# Patient Record
Sex: Male | Born: 1940 | Race: White | Hispanic: No | Marital: Married | Smoking: Never smoker
Health system: Southern US, Community
[De-identification: ages and names within clinical notes are randomized; demographics above are authoritative.]

## PROBLEM LIST (undated history)

## (undated) DIAGNOSIS — I10 Essential (primary) hypertension: Secondary | ICD-10-CM

## (undated) DIAGNOSIS — K5792 Diverticulitis of intestine, part unspecified, without perforation or abscess without bleeding: Secondary | ICD-10-CM

## (undated) DIAGNOSIS — K219 Gastro-esophageal reflux disease without esophagitis: Secondary | ICD-10-CM

## (undated) DIAGNOSIS — I452 Bifascicular block: Secondary | ICD-10-CM

## (undated) DIAGNOSIS — R42 Dizziness and giddiness: Secondary | ICD-10-CM

## (undated) HISTORY — PX: COLON RESECTION: SHX5231

---

## 2001-08-17 ENCOUNTER — Encounter: Admission: RE | Admit: 2001-08-17 | Discharge: 2001-08-17 | Payer: Self-pay | Admitting: Family Medicine

## 2001-08-17 ENCOUNTER — Encounter: Payer: Self-pay | Admitting: Family Medicine

## 2002-01-06 ENCOUNTER — Encounter: Payer: Self-pay | Admitting: Family Medicine

## 2002-01-06 ENCOUNTER — Encounter: Admission: RE | Admit: 2002-01-06 | Discharge: 2002-01-06 | Payer: Self-pay | Admitting: Family Medicine

## 2002-02-09 ENCOUNTER — Ambulatory Visit (HOSPITAL_COMMUNITY): Admission: RE | Admit: 2002-02-09 | Discharge: 2002-02-09 | Payer: Self-pay | Admitting: Gastroenterology

## 2002-03-21 ENCOUNTER — Inpatient Hospital Stay (HOSPITAL_COMMUNITY): Admission: EM | Admit: 2002-03-21 | Discharge: 2002-03-24 | Payer: Self-pay | Admitting: Emergency Medicine

## 2002-03-21 ENCOUNTER — Encounter: Payer: Self-pay | Admitting: Family Medicine

## 2002-03-21 ENCOUNTER — Encounter (INDEPENDENT_AMBULATORY_CARE_PROVIDER_SITE_OTHER): Payer: Self-pay | Admitting: Specialist

## 2002-03-21 ENCOUNTER — Encounter: Admission: RE | Admit: 2002-03-21 | Discharge: 2002-03-21 | Payer: Self-pay | Admitting: Family Medicine

## 2005-01-08 ENCOUNTER — Encounter: Admission: RE | Admit: 2005-01-08 | Discharge: 2005-01-08 | Payer: Self-pay | Admitting: Family Medicine

## 2008-03-09 ENCOUNTER — Encounter: Admission: RE | Admit: 2008-03-09 | Discharge: 2008-03-09 | Payer: Self-pay | Admitting: Family Medicine

## 2011-03-13 NOTE — H&P (Signed)
[facility]  Patient:    AIRON, [name] Visit Number: [id number] MRN: [id number]          Service Type: MED Location: [phone] Attending Physician:  [name] Dictated by:   Zigmund Daniel, M.D. Admit Date:  03/21/2002                           History and Physical  CHIEF COMPLAINT:  Abdominal pain.  HISTORY OF PRESENT ILLNESS:  The patient is a generally healthy 70 year old man who felt well until last night about 9 oclock when he insidiously developed mid abdominal pain.  It became quite severe, and he vomited a number of times.  It persisted today, and he saw Dr. Arvilla Market at the office, and he found that he had abdominal tenderness and an elevation of his white blood cell count, and he sent him for a CT scan of the abdomen which showed a thickened loop of small bowel consistent either with Meckels diverticulitis or an intussusceptum.  The patient is feeling a little better but still has pain and tenderness.  He was advised to come into the hospital for exploratory laparotomy and accepted my recommendation.  PAST MEDICAL HISTORY:  Hypertension, on Monopril.  He takes Pravachol for high cholesterol.  He has no history of heart or lung disease.  No chronic GI diseases.  No previous surgery.  No serious chronic ailments of any type.  ALLERGIES:  He denied medication allergies.  MEDICATIONS:  He is on no other medications.  FAMILY HISTORY, REVIEW OF SYSTEMS, CHILDHOOD ILLNESSES:  Unremarkable.  SOCIAL HISTORY:  He does not smoke or drink.  PHYSICAL EXAMINATION:  GENERAL:  Healthy-appearing man in slight pain.  Mental status normal.  VITAL SIGNS:  Normal, as recorded by nursing staff.  HEENT:  Head, neck, eyes, ears, nose, mouth, and throat are unremarkable.  CHEST:  Clear to auscultation.  HEART:  Rate and rhythm normal.  No murmur or gallop.  ABDOMEN:  No mass.  There is moderate mid abdominal tenderness with  slight rebound tenderness.  No organomegaly detectable.  No hernia.  GENITOURINARY:  Normal.  RECTAL:  Normal.  EXTREMITIES:  Normal.  NEUROLOGIC:  No gross abnormalities.  SKIN:  No lesions noted.  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  PLAN:  Immediate exploratory laparotomy. Dictated by:   Zigmund Daniel, M.D. Attending Physician:  [name] DD:  03/21/02 TD:  03/23/02 Job: [id number]

## 2011-03-13 NOTE — Op Note (Signed)
[facility]  Patient:    SHANON, BECVAR Visit Number: [id number] MRN: [id number]          Service Type: MED Location: [phone] Attending Physician:  Meredith Leeds Dictated by:   Zigmund Daniel, M.D. Proc. Date: 03/21/02 Admit Date:  03/21/2002   CC:         Desma Maxim, M.D.   Operative Report  PREOPERATIVE DIAGNOSES:       Acute abdomen, probable intussusception.  POSTOPERATIVE DIAGNOSIS:      Meckels diverticulitis.  OPERATION:                    Meckels diverticulectomy.  SURGEON:                      Zigmund Daniel, M.D.  ANESTHESIA:                   General.  DESCRIPTION OF PROCEDURE:     After the patient was monitored and anesthetized, and had routine preparation and draping of the abdomen, I made an incision between the umbilicus and pubic symphysis and entered the peritoneal cavity.  A small amount of serous-type noncloudy fluid was present.  I reached down and palpated a thickened piece of intestine, and easily pulled it up into the wound; it was quite obviously an inflamed Meckels diverticulum.  It was very broad based and the bowel at that point was quite broad as well; it looked like it could easily be resected at its base.  The thickness of the diverticulum was normal at the base.  I segmentally divided its mesentery between Kelly clamps, and ligated the vessels with 2-0 silk and then stapled and removed it.  The staple line appeared secure and the lumen of the small intestine appeared adequate.  Hemostasis was good.  I then irrigated the abdominal cavity briefly, and removed irrigant.  Sponge, needle and instrument counts were correct.  I inspected the distal small bowel, and it appeared to be normal.  I inspected the proximal bowel and it also was normal. The liver felt normal.  I closed the fascia with running #1 PDS and closed the skin with staples.  The patient was stable through the procedure and  tolerated it well. Dictated by:   Zigmund Daniel, M.D. Attending Physician:  Meredith Leeds DD:  03/21/02 TD:  03/23/02 Job: [id number]

## 2011-03-13 NOTE — Discharge Summary (Signed)
[facility]  Patient:    [name], [name] Visit Number: [id number] MRN: [id number]          Service Type: MED Location: [phone] Attending Physician:  Meredith Leeds Dictated by:   Zigmund Daniel, M.D. Admit Date:  03/21/2002 Discharge Date: 03/24/2002   CC:         Desma Maxim, M.D.   Discharge Summary  HISTORY OF PRESENT ILLNESS:  The patient is a 70 year old white male who had a 1-day history of insidiously developing right-sided abdominal pain.  It was then central at first.  Dr. Arvilla Market saw him and found abdominal tenderness and elevation of the white blood cell count.  A CT scan demonstrated a thickened loop of small bowel consistent with Meckels diverticulitis or an intussusception.  The patient was admitted to the hospital for surgery after I saw him in the emergency department.  He has high cholesterol and hypertension but general health is good beyond that.  He is a nonsmoker and does not drink alcoholic beverages.  HOSPITAL COURSE:  As soon as possible after fluid resuscitation and administration of antibiotics, I took the patient to the operating room.  He had Meckels diverticulitis and underwent a Meckels diverticulectomy by stapling the diverticulum at the base.  He made a steady recovery after that and had no complications of infection nor otherwise.  He was discharged on the third postoperative day with his staples in place.  Pathology was benign and was consistent with diverticulitis.  I gave him a prescription for Vicodin and asked him to see me in several days for removal of his staples.  DIAGNOSIS:  Meckels diverticulitis.  OPERATION:  Meckels diverticulectomy.  DISCHARGE CONDITION:  Improved. Dictated by:   Zigmund Daniel, M.D. Attending Physician:  Meredith Leeds DD:  04/07/02 TD:  04/10/02 Job: [id number]

## 2011-03-13 NOTE — Procedures (Signed)
[facility]  Patient:    [name], [name] Visit Number: [id number] MRN: [id number]          Service Type: END Location: ENDO Attending Physician:  Dennison Bulla Ii Dictated by:   Verlin Grills, M.D. Proc. Date: 02/09/02 Admit Date:  02/09/2002 Discharge Date: 02/09/2002   CC:         Desma Maxim, M.D.   Procedure Report  DATE OF BIRTH:  [birth year].  REFERRING PHYSICIAN:  Desma Maxim, M.D.  PROCEDURE PERFORMED:  Screening colonoscopy.  ENDOSCOPIST:  Verlin Grills, M.D.  INDICATIONS FOR PROCEDURE:  The patient is a 70 year old male.  He is scheduled to undergo his first screening colonoscopy with polypectomy to prevent colon cancer.  His flexible proctosigmoidoscopy performed January 1998 was normal.  I discussed with the patient the complications associated with colonoscopy and polypectomy including a 15 per 1000 risk of bleeding and 4 per 1000 risk of colon perforation requiring surgical repair.  The patient has signed the operative permit.  PREMEDICATION:  Versed 10 mg, fentanyl 50 mcg.  ENDOSCOPE:  Olympus pediatric video colonoscope.  DESCRIPTION OF PROCEDURE:  After obtaining informed consent, the patient was placed in the left lateral decubitus position.  I administered intravenous fentanyl and intravenous Versed to achieve conscious sedation for the procedure.  The patients blood pressure, oxygen saturation and cardiac rhythm were monitored throughout the procedure and documented in the medical record.  Anal inspection was normal.  Digital rectal exam revealed a nonnodular prostate. The Olympus pediatric video colonoscope was then introduced into the rectum and easily advanced to the cecum.  Colonic preparation for the exam today was excellent.  Rectum:  Normal.  Sigmoid colon and descending colon:  Normal.  Splenic flexure:  Normal.  Transverse colon:  Normal.  Hepatic  flexure:  Normal.  Ascending colon:  Normal.  Cecum and ileocecal valve:  Normal.  ASSESSMENT:  Normal screening proctocolonoscopy to the cecum.  No endoscopic evidence for the presence of colorectal neoplasia. Dictated by:   Verlin Grills, M.D. Attending Physician:  Dennison Bulla Ii DD:  02/09/02 TD:  02/09/02 Job: [phone] [id number]

## 2012-02-22 ENCOUNTER — Ambulatory Visit
Admission: RE | Admit: 2012-02-22 | Discharge: 2012-02-22 | Disposition: A | Discharge status: Home / Self Care | Payer: Medicare Other | Source: Ambulatory Visit | Attending: Family Medicine | Admitting: Family Medicine

## 2012-02-22 ENCOUNTER — Other Ambulatory Visit: Payer: Self-pay | Admitting: Family Medicine

## 2012-02-22 DIAGNOSIS — R609 Edema, unspecified: Secondary | ICD-10-CM

## 2014-07-17 ENCOUNTER — Other Ambulatory Visit: Payer: Self-pay | Admitting: Gastroenterology

## 2014-08-01 ENCOUNTER — Other Ambulatory Visit: Payer: Self-pay | Admitting: Gastroenterology

## 2014-08-13 ENCOUNTER — Encounter (HOSPITAL_COMMUNITY): Payer: Self-pay | Admitting: *Deleted

## 2014-08-15 ENCOUNTER — Encounter (HOSPITAL_COMMUNITY): Payer: Self-pay | Admitting: Pharmacy Technician

## 2014-08-21 ENCOUNTER — Encounter (HOSPITAL_COMMUNITY): Payer: Medicare HMO | Admitting: Certified Registered Nurse Anesthetist

## 2014-08-21 ENCOUNTER — Ambulatory Visit (HOSPITAL_COMMUNITY)
Admission: RE | Admit: 2014-08-21 | Discharge: 2014-08-21 | Disposition: A | Discharge status: Home / Self Care | Payer: Medicare HMO | Source: Ambulatory Visit | Attending: Gastroenterology | Admitting: Gastroenterology

## 2014-08-21 ENCOUNTER — Ambulatory Visit (HOSPITAL_COMMUNITY): Payer: Medicare HMO | Admitting: Certified Registered Nurse Anesthetist

## 2014-08-21 ENCOUNTER — Encounter (HOSPITAL_COMMUNITY): Payer: Self-pay | Admitting: *Deleted

## 2014-08-21 ENCOUNTER — Encounter (HOSPITAL_COMMUNITY)
Admission: RE | Disposition: A | Discharge status: Home / Self Care | Payer: Medicare Other | Source: Ambulatory Visit | Attending: Gastroenterology

## 2014-08-21 DIAGNOSIS — E780 Pure hypercholesterolemia: Secondary | ICD-10-CM | POA: Diagnosis not present

## 2014-08-21 DIAGNOSIS — Z7982 Long term (current) use of aspirin: Secondary | ICD-10-CM | POA: Diagnosis not present

## 2014-08-21 DIAGNOSIS — R195 Other fecal abnormalities: Secondary | ICD-10-CM | POA: Insufficient documentation

## 2014-08-21 DIAGNOSIS — N400 Enlarged prostate without lower urinary tract symptoms: Secondary | ICD-10-CM | POA: Diagnosis not present

## 2014-08-21 DIAGNOSIS — I10 Essential (primary) hypertension: Secondary | ICD-10-CM | POA: Insufficient documentation

## 2014-08-21 HISTORY — DX: Essential (primary) hypertension: I10

## 2014-08-21 HISTORY — DX: Diverticulitis of intestine, part unspecified, without perforation or abscess without bleeding: K57.92

## 2014-08-21 HISTORY — PX: COLONOSCOPY WITH PROPOFOL: SHX5780

## 2014-08-21 HISTORY — DX: Gastro-esophageal reflux disease without esophagitis: K21.9

## 2014-08-21 SURGERY — COLONOSCOPY WITH PROPOFOL
Anesthesia: Monitor Anesthesia Care

## 2014-08-21 MED ORDER — PROPOFOL 10 MG/ML IV BOLUS
INTRAVENOUS | Status: DC | PRN
  Administered 2014-08-21: 30 mg via INTRAVENOUS
  Administered 2014-08-21 (×2): 20 mg via INTRAVENOUS

## 2014-08-21 MED ORDER — LIDOCAINE HCL (CARDIAC) 20 MG/ML IV SOLN
INTRAVENOUS | Status: AC
  Filled 2014-08-21: qty 5

## 2014-08-21 MED ORDER — SODIUM CHLORIDE 0.9 % IV SOLN: INTRAVENOUS | Status: DC

## 2014-08-21 MED ORDER — PROPOFOL 10 MG/ML IV BOLUS
INTRAVENOUS | Status: AC
Start: 2014-08-21 — End: 2014-08-21
  Filled 2014-08-21: qty 20

## 2014-08-21 MED ORDER — LIDOCAINE HCL (CARDIAC) 20 MG/ML IV SOLN
INTRAVENOUS | Status: DC | PRN
  Administered 2014-08-21: 80 mg via INTRAVENOUS

## 2014-08-21 MED ORDER — PROPOFOL INFUSION 10 MG/ML OPTIME
INTRAVENOUS | Status: DC | PRN
  Administered 2014-08-21: 200 ug/kg/min via INTRAVENOUS

## 2014-08-21 MED ORDER — ONDANSETRON HCL 4 MG/2ML IJ SOLN
INTRAMUSCULAR | Status: AC
  Filled 2014-08-21: qty 2

## 2014-08-21 MED ORDER — PROPOFOL 10 MG/ML IV BOLUS
INTRAVENOUS | Status: AC
  Filled 2014-08-21: qty 20

## 2014-08-21 MED ORDER — LACTATED RINGERS IV SOLN
INTRAVENOUS | Status: DC
  Administered 2014-08-21: 1000 mL via INTRAVENOUS

## 2014-08-21 MED ORDER — ONDANSETRON HCL 4 MG/2ML IJ SOLN
INTRAMUSCULAR | Status: DC | PRN
  Administered 2014-08-21: 4 mg via INTRAVENOUS

## 2014-08-21 SURGICAL SUPPLY — 22 items

## 2014-08-21 NOTE — Transfer of Care (Signed)
Immediate Anesthesia Transfer of Care Note  Patient: [name]  Procedure(s) Performed: Procedure(s) (LRB): COLONOSCOPY WITH PROPOFOL (N/A)  Patient Location: endoscopy  Anesthesia Type: MAC  Level of Consciousness: sedated, patient cooperative and responds to stimulation  Airway & Oxygen Therapy: Patient Spontanous Breathing and Patient connected to face mask oxgen  Post-op Assessment: Report given to endoscopy RN and Post -op Vital signs reviewed and stable  Post vital signs: Reviewed and stable  Complications: No apparent anesthesia complications

## 2014-08-21 NOTE — Discharge Instructions (Signed)

## 2014-08-21 NOTE — Anesthesia Preprocedure Evaluation (Signed)
Anesthesia Evaluation  Patient identified by MRN, date of birth, ID band Patient awake    Reviewed: Allergy & Precautions, H&P , NPO status , Patient's Chart, lab work & pertinent test results  Airway Mallampati: II  TM Distance: >3 FB Neck ROM: Full    Dental no notable dental hx.    Pulmonary neg pulmonary ROS,  breath sounds clear to auscultation  Pulmonary exam normal       Cardiovascular hypertension, Pt. on medications Rhythm:Regular Rate:Normal     Neuro/Psych negative neurological ROS  negative psych ROS   GI/Hepatic Neg liver ROS, GERD-  Medicated,  Endo/Other  negative endocrine ROS  Renal/GU negative Renal ROS     Musculoskeletal negative musculoskeletal ROS (+)   Abdominal   Peds  Hematology negative hematology ROS (+)   Anesthesia Other Findings   Reproductive/Obstetrics negative OB ROS                             Anesthesia Physical Anesthesia Plan  ASA: II  Anesthesia Plan: MAC   Post-op Pain Management:    Induction: Intravenous  Airway Management Planned:   Additional Equipment:   Intra-op Plan:   Post-operative Plan:   Informed Consent: I have reviewed the patients History and Physical, chart, labs and discussed the procedure including the risks, benefits and alternatives for the proposed anesthesia with the patient or authorized representative who has indicated his/her understanding and acceptance.   Dental advisory given  Plan Discussed with: CRNA  Anesthesia Plan Comments:         Anesthesia Quick Evaluation

## 2014-08-21 NOTE — H&P (Signed)
  Problem: Heme positive stool on aspirin therapy. Normal hemoglobin. Normal colonoscopy performed on 09/30/2006  History: The patient is a 73 year old male born [birth year]. He underwent a normal screening colonoscopy on 09/30/2006. He chronically takes aspirin 81 mg daily. Intermittently he experiences epigastric indigestion in the evening.  The patient feels well and reports no gastrointestinal bleeding. As part of his health maintenance physical exam, the patient submitted stool for Hemoccult testing and his stool was heme positive. His CBC was normal.  The patient is scheduled to undergo a diagnostic colonoscopy today.  Past medical history: Hypertension. Hypercholesterolemia. Benign prostatic hypertrophy. Meckel's diverticulum surgery.  Allergies: None  Exam: The patient is alert and lying comfortably on the endoscopy stretcher. Abdomen is soft and nontender to palpation. Lungs are clear to auscultation. Cardiac exam reveals a regular rhythm.  Plan: Proceed with diagnostic colonoscopy.

## 2014-08-21 NOTE — Anesthesia Postprocedure Evaluation (Signed)
Anesthesia Post Note  Patient: [name]  Procedure(s) Performed: Procedure(s) (LRB): COLONOSCOPY WITH PROPOFOL (N/A)  Anesthesia type: MAC  Patient location: PACU  Post pain: Pain level controlled  Post assessment: Post-op Vital signs reviewed  Last Vitals: BP 136/67  Pulse 52  Temp(Src) 36.5 C (Oral)  Resp 15  Ht 5\' 10"  (1.778 m)  Wt 190 lb (86.183 kg)  BMI 27.26 kg/m2  SpO2 97%  Post vital signs: Reviewed  Level of consciousness: awake  Complications: No apparent anesthesia complications

## 2014-08-21 NOTE — Op Note (Signed)
Problem: Heme positive stool on chronic aspirin therapy. Normal hemoglobin. Normal screening colonoscopy performed on 09/30/2006  Endoscopist: Earle Gell  Premedication: Propofol administered by anesthesia  Procedure: Diagnostic colonoscopy The patient was placed in the left lateral decubitus position. Anal inspection and digital rectal exam were normal. The Pentax pediatric colonoscope was introduced into the rectum and advanced to the cecum. A normal-appearing appendiceal orifice was identified. A normal-appearing ileocecal valve was identified. Colonic preparation for the exam today is good  Rectum. Normal. Retroflexed view of the distal rectum normal  Sigmoid colon and descending colon. Normal  Splenic flexure. Normal  Transverse colon. Normal  Hepatic flexure. Normal  Ascending colon. Normal  Cecum and ileocecal valve. Normal  Assessment: Normal diagnostic colonoscopy

## 2014-08-23 ENCOUNTER — Encounter (HOSPITAL_COMMUNITY): Payer: Self-pay | Admitting: Gastroenterology

## 2015-09-08 ENCOUNTER — Observation Stay (HOSPITAL_COMMUNITY)
Admission: EM | Admit: 2015-09-08 | Discharge: 2015-09-09 | Disposition: A | Discharge status: Home / Self Care | Payer: Commercial Managed Care - HMO | Attending: Internal Medicine | Admitting: Internal Medicine

## 2015-09-08 ENCOUNTER — Emergency Department (HOSPITAL_COMMUNITY): Payer: Commercial Managed Care - HMO

## 2015-09-08 ENCOUNTER — Encounter (HOSPITAL_COMMUNITY): Payer: Self-pay | Admitting: Family Medicine

## 2015-09-08 DIAGNOSIS — R42 Dizziness and giddiness: Secondary | ICD-10-CM | POA: Diagnosis not present

## 2015-09-08 DIAGNOSIS — I452 Bifascicular block: Secondary | ICD-10-CM | POA: Diagnosis not present

## 2015-09-08 DIAGNOSIS — E7800 Pure hypercholesterolemia, unspecified: Secondary | ICD-10-CM | POA: Insufficient documentation

## 2015-09-08 DIAGNOSIS — R079 Chest pain, unspecified: Principal | ICD-10-CM | POA: Insufficient documentation

## 2015-09-08 DIAGNOSIS — Z79899 Other long term (current) drug therapy: Secondary | ICD-10-CM | POA: Insufficient documentation

## 2015-09-08 DIAGNOSIS — I10 Essential (primary) hypertension: Secondary | ICD-10-CM | POA: Insufficient documentation

## 2015-09-08 DIAGNOSIS — R072 Precordial pain: Secondary | ICD-10-CM | POA: Diagnosis not present

## 2015-09-08 DIAGNOSIS — Z7982 Long term (current) use of aspirin: Secondary | ICD-10-CM | POA: Insufficient documentation

## 2015-09-08 HISTORY — DX: Essential (primary) hypertension: I10

## 2015-09-08 HISTORY — DX: Bifascicular block: I45.2

## 2015-09-08 HISTORY — DX: Dizziness and giddiness: R42

## 2015-09-08 LAB — BASIC METABOLIC PANEL
ANION GAP: 7 (ref 5–15)
BUN: 13 mg/dL (ref 6–20)
CHLORIDE: 109 mmol/L (ref 101–111)
CO2: 25 mmol/L (ref 22–32)
CREATININE: 0.95 mg/dL (ref 0.61–1.24)
Calcium: 8.7 mg/dL — ABNORMAL LOW (ref 8.9–10.3)
GFR calc non Af Amer: >60 mL/min (ref >60)
GLUCOSE: 111 mg/dL — AB (ref 65–99)
Potassium: 4 mmol/L (ref 3.5–5.1)
Sodium: 141 mmol/L (ref 135–145)

## 2015-09-08 LAB — I-STAT TROPONIN, ED
Troponin i, poc: 0 ng/mL (ref 0.00–0.08)
Troponin i, poc: 0.01 ng/mL (ref 0.00–0.08)

## 2015-09-08 LAB — CBC
HCT: 45.2 % (ref 39.0–52.0)
HEMOGLOBIN: 15.4 g/dL (ref 13.0–17.0)
MCH: 31.1 pg (ref 26.0–34.0)
MCHC: 34.1 g/dL (ref 30.0–36.0)
MCV: 91.3 fL (ref 78.0–100.0)
Platelets: 129 10*3/uL — ABNORMAL LOW (ref 150–400)
RBC: 4.95 MIL/uL (ref 4.22–5.81)
RDW: 12.9 % (ref 11.5–15.5)
WBC: 5.1 10*3/uL (ref 4.0–10.5)

## 2015-09-08 MED ORDER — ASPIRIN 81 MG PO TABS: 81.0000 mg | ORAL_TABLET | Freq: Every day | ORAL | Status: DC

## 2015-09-08 MED ORDER — ASPIRIN 300 MG RE SUPP: 300.0000 mg | RECTAL | Status: AC

## 2015-09-08 MED ORDER — SODIUM CHLORIDE 0.9 % IV SOLN: 250.0000 mL | INTRAVENOUS | Status: DC | PRN

## 2015-09-08 MED ORDER — LORATADINE 10 MG PO TABS: 10.0000 mg | ORAL_TABLET | Freq: Every day | ORAL | Status: DC | PRN

## 2015-09-08 MED ORDER — TADALAFIL 5 MG PO TABS
5.0000 mg | ORAL_TABLET | Freq: Every evening | ORAL | Status: DC
  Filled 2015-09-08 (×2): qty 1

## 2015-09-08 MED ORDER — SODIUM CHLORIDE 0.9 % IJ SOLN
3.0000 mL | Freq: Two times a day (BID) | INTRAMUSCULAR | Status: DC
  Administered 2015-09-08 – 2015-09-09 (×2): 3 mL via INTRAVENOUS

## 2015-09-08 MED ORDER — VITAMIN C 500 MG PO TABS
1000.0000 mg | ORAL_TABLET | ORAL | Status: DC
  Administered 2015-09-09: 1000 mg via ORAL
  Filled 2015-09-08: qty 2

## 2015-09-08 MED ORDER — ACETAMINOPHEN 325 MG PO TABS: 650.0000 mg | ORAL_TABLET | ORAL | Status: DC | PRN

## 2015-09-08 MED ORDER — GLUCOSAMINE 750 MG PO TABS: 1.0000 | ORAL_TABLET | ORAL | Status: DC

## 2015-09-08 MED ORDER — AMLODIPINE BESYLATE 5 MG PO TABS
5.0000 mg | ORAL_TABLET | ORAL | Status: DC
  Administered 2015-09-09: 5 mg via ORAL
  Filled 2015-09-08: qty 1

## 2015-09-08 MED ORDER — FLUOCINONIDE 0.05 % EX CREA: 1.0000 "application " | TOPICAL_CREAM | Freq: Every day | CUTANEOUS | Status: DC | PRN

## 2015-09-08 MED ORDER — ASPIRIN EC 81 MG PO TBEC
81.0000 mg | DELAYED_RELEASE_TABLET | Freq: Every day | ORAL | Status: DC
Start: 2015-09-09 — End: 2015-09-09
  Administered 2015-09-09: 81 mg via ORAL
  Filled 2015-09-08: qty 1

## 2015-09-08 MED ORDER — SIMVASTATIN 20 MG PO TABS
20.0000 mg | ORAL_TABLET | Freq: Every evening | ORAL | Status: DC
  Administered 2015-09-08: 20 mg via ORAL
  Filled 2015-09-08: qty 1

## 2015-09-08 MED ORDER — ONDANSETRON HCL 4 MG/2ML IJ SOLN: 4.0000 mg | Freq: Four times a day (QID) | INTRAMUSCULAR | Status: DC | PRN

## 2015-09-08 MED ORDER — SODIUM CHLORIDE 0.9 % IJ SOLN: 3.0000 mL | INTRAMUSCULAR | Status: DC | PRN

## 2015-09-08 MED ORDER — NITROGLYCERIN 0.4 MG SL SUBL: 0.4000 mg | SUBLINGUAL_TABLET | SUBLINGUAL | Status: DC | PRN

## 2015-09-08 MED ORDER — LISINOPRIL 40 MG PO TABS
40.0000 mg | ORAL_TABLET | ORAL | Status: DC
  Administered 2015-09-09: 40 mg via ORAL
  Filled 2015-09-08: qty 1

## 2015-09-08 MED ORDER — ADULT MULTIVITAMIN W/MINERALS CH
1.0000 | ORAL_TABLET | ORAL | Status: DC
  Administered 2015-09-09: 1 via ORAL
  Filled 2015-09-08: qty 1

## 2015-09-08 MED ORDER — ASPIRIN 81 MG PO CHEW
324.0000 mg | CHEWABLE_TABLET | Freq: Once | ORAL | Status: AC
  Administered 2015-09-08: 324 mg via ORAL
  Filled 2015-09-08: qty 4

## 2015-09-08 MED ORDER — IBUPROFEN 400 MG PO TABS: 400.0000 mg | ORAL_TABLET | Freq: Three times a day (TID) | ORAL | Status: DC | PRN

## 2015-09-08 NOTE — ED Provider Notes (Signed)
CSN: [id number]     Arrival date & time 09/08/15  1027 History   First MD Initiated Contact with Patient 09/08/15 1036     Chief Complaint  Patient presents with  . Chest Pain  . Dizziness     (Consider location/radiation/quality/duration/timing/severity/associated sxs/prior Treatment) Patient is a 74 y.o. male presenting with chest pain and dizziness.  Chest Pain Pain location:  L chest Pain quality comment:  "soreness" like a muscle pull" Pain radiates to:  Does not radiate Pain radiates to the back: no   Duration: dizziness and CP going off and on for 6-8 weeks. This AM 645AM started to have pain, lightheadedness after breakfast, improved on arrival to EDE. Timing:  Intermittent Progression:  Waxing and waning Chronicity:  Recurrent Context: at rest   Context comment:  Not exertional Relieved by:  Nothing Worsened by:  Nothing tried Ineffective treatments:  None tried Associated symptoms: dizziness, fatigue and near-syncope   Associated symptoms: no abdominal pain, no back pain, no cough, no diaphoresis, no fever, no headache, no lower extremity edema, no nausea (a few weeks ago had it, this time no nausea), no numbness, no palpitations, no shortness of breath, no syncope, not vomiting and no weakness   Risk factors: high cholesterol, hypertension and male sex   Risk factors: no coronary artery disease, no diabetes mellitus, no prior DVT/PE, no smoking and no surgery   Risk factors comment:  Dad died at 34 of MI Dizziness Associated symptoms: chest pain   Associated symptoms: no headaches, no nausea (a few weeks ago had it, this time no nausea), no palpitations, no shortness of breath, no syncope, no vomiting and no weakness   CATH WITH DR. Tamala Julian 20 YRS AGO  Past Medical History  Diagnosis Date  . Hypertension   . GERD (gastroesophageal reflux disease)     uses Tums occ.  . Diverticulitis    Past Surgical History  Procedure Laterality Date  . Colon resection     diverticulitis  . Colonoscopy with propofol N/A 08/21/2014    Procedure: COLONOSCOPY WITH PROPOFOL;  Surgeon: Garlan Fair, MD;  Location: [facility] ENDOSCOPY;  Service: Endoscopy;  Laterality: N/A;   No family history on file. Social History  Substance Use Topics  . Smoking status: Never Smoker   . Smokeless tobacco: Never Used  . Alcohol Use: No    Review of Systems  Constitutional: Positive for fatigue. Negative for fever and diaphoresis.  HENT: Negative for sore throat.   Eyes: Negative for visual disturbance.  Respiratory: Negative for cough and shortness of breath.   Cardiovascular: Positive for chest pain and near-syncope. Negative for palpitations and syncope.  Gastrointestinal: Negative for nausea (a few weeks ago had it, this time no nausea), vomiting and abdominal pain.  Genitourinary: Negative for difficulty urinating.  Musculoskeletal: Negative for back pain and neck stiffness.  Skin: Negative for rash.  Neurological: Positive for dizziness. Negative for syncope, weakness, numbness and headaches.      Allergies  Review of patient's allergies indicates no known allergies.  Home Medications   Prior to Admission medications   Medication Sig Start Date End Date Taking? Authorizing Provider  amLODipine (NORVASC) 5 MG tablet Take 5 mg by mouth every morning.   Yes Historical Provider, MD  Ascorbic Acid (VITAMIN C) 1000 MG tablet Take 1,000 mg by mouth every morning.   Yes Historical Provider, MD  aspirin 81 MG tablet Take 81 mg by mouth daily.   Yes Historical Provider, MD  Calcium-Magnesium-Vitamin D (  CALCIUM 500 PO) Take 1 capsule by mouth every evening.   Yes Historical Provider, MD  Glucosamine 750 MG TABS Take 1 tablet by mouth every morning.   Yes Historical Provider, MD  ibuprofen (ADVIL,MOTRIN) 200 MG tablet Take 400 mg by mouth every 8 (eight) hours as needed for headache or mild pain.   Yes Historical Provider, MD  lisinopril (PRINIVIL,ZESTRIL) 40 MG tablet Take  40 mg by mouth every morning.   Yes Historical Provider, MD  loratadine (CLARITIN) 10 MG tablet Take 10 mg by mouth daily as needed for allergies.   Yes Historical Provider, MD  Multiple Vitamin (MULTIVITAMIN WITH MINERALS) TABS tablet Take 1 tablet by mouth every morning.   Yes Historical Provider, MD  Omega-3 Fatty Acids (OMEGA 3 PO) Take 1 tablet by mouth every morning.   Yes Historical Provider, MD  Probiotic Product (PROBIOTIC DAILY PO) Take 1 capsule by mouth daily.   Yes Historical Provider, MD  simvastatin (ZOCOR) 20 MG tablet Take 20 mg by mouth every evening.   Yes Historical Provider, MD  tadalafil (CIALIS) 5 MG tablet Take 5 mg by mouth every evening.   Yes Historical Provider, MD  fluocinonide cream (LIDEX) [id number] % Apply 1 application topically daily as needed (dry skin.).    Historical Provider, MD   BP 130/71 mmHg  Pulse 63  Temp(Src) 98.1 F (36.7 C) (Oral)  Resp 16  Ht 5\' 5"  (1.651 m)  Wt 197 lb (89.359 kg)  BMI 32.78 kg/m2  SpO2 95% Physical Exam  Constitutional: He is oriented to person, place, and time. He appears well-developed and well-nourished. No distress.  HENT:  Head: Normocephalic and atraumatic.  Eyes: Conjunctivae and EOM are normal.  Neck: Normal range of motion.  Cardiovascular: Normal rate, regular rhythm, normal heart sounds and intact distal pulses.  Exam reveals no gallop and no friction rub.   No murmur heard. Pulmonary/Chest: Effort normal and breath sounds normal. No respiratory distress. He has no wheezes. He has no rales.  Abdominal: Soft. He exhibits no distension. There is no tenderness. There is no guarding.  Musculoskeletal: He exhibits no edema.  Neurological: He is alert and oriented to person, place, and time.  Skin: Skin is warm and dry. He is not diaphoretic.  Nursing note and vitals reviewed.   ED Course  Procedures (including critical care time) Labs Review Labs Reviewed  BASIC METABOLIC PANEL - Abnormal; Notable for the  following:    Glucose, Bld 111 (*)    Calcium 8.7 (*)    All other components within normal limits  CBC - Abnormal; Notable for the following:    Platelets 129 (*)    All other components within normal limits  I-STAT TROPOININ, ED  I-STAT TROPOININ, ED    Imaging Review Dg Chest 2 View  09/08/2015  CLINICAL DATA:  Acute left chest pain, cough and congestion for 5 days. EXAM: CHEST  2 VIEW COMPARISON:  None available FINDINGS: Normal heart size and vascularity. No focal pneumonia, collapse or consolidation. Negative for edema, effusion or pneumothorax. Trachea midline. Minor endplate degenerative changes of the thoracic spine. Slight central bronchitic change and interstitial prominence. IMPRESSION: Minor bronchitic change and central interstitial prominence. No focal pneumonia or edema. Electronically Signed   By: Jerilynn Mages.  Shick M.D.   On: 09/08/2015 11:29   I have personally reviewed and evaluated these images and lab results as part of my medical decision-making.   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 Differential diagnosis for chest pain includes pulmonary embolus, dissection, pneumothorax, pneumonia, ACS, myocarditis, pericarditis.  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 Chest x-ray was done and evaluated by me and radiology and showed no sign of pneumonia or pneumothorax. Patient is low risk [place] and have low suspicion for PE.  Patient is high risk HEART score and had delta troponins  which were both negative. Given this evaluation, history and physical have low suspicion for pulmonary embolus, pneumonia, ACS, myocarditis, pericarditis, dissection.  Patient is high risk, and given this and no recent evaluation for chest pain (last cath approx 20years ago with Dr. Tamala Julian per pt), consulted Cardiology for evaluation.  Cardiology will admit pt for further care. Pt received ASA and was CP free during time in ED.   Gareth Morgan, MD 09/08/15 2218

## 2015-09-08 NOTE — Progress Notes (Signed)
09/08/2015 1800  Received pt to room 2w28 from ER.  Pt is A&O, no c/o CP at this time.  Tele monitor on and CCMD notified.  Oriented to room, call light and bed.  Call bell in reach.  Family at bedside. [place]

## 2015-09-08 NOTE — ED Notes (Signed)
Brought patient back to room via wheelchair with family in tow; patient undressed, in gown, on monitor, continuous pulse oximetry and blood pressure cuff; visitor at bedside; pharmacy tech at bedside

## 2015-09-08 NOTE — ED Notes (Signed)
Pt here for chest pain and dizziness that started this am. sts that he had some dizziness a few weeks ago. sts he went to the fire station in [place] and they told him to come here. sts he has been sick with cough and cold lately.

## 2015-09-08 NOTE — H&P (Signed)
History and Physical  Patient ID: [name] MRN: [id number], SOB: 27-Nov-1940 74 y.o. Date of Encounter: 09/08/2015, 4:41 PM  Primary Physician: Mayra Neer, MD Primary Cardiologist: hws Primary Electrophysiologist:  NEW  Chief Complaint:  CHEST PAIN  History of Present Illness: [name] is a 74 y.o. male  With no history of coronary disease point a history of bundle branch block who was had a stress test years ago by Dr. Rebecca Eaton who over the last series of months has had recurrent episodes of a.m. chest discomfort. These last 5-15 minutes they are quite vague. They are broadly distributed over his left chest and are not aggravated by motion. There is no accompanying shortness of breath. They have resolved spontaneously. Today because of recurrent discomfort his son-in-law, a Agricultural consultant, suggested he go to the fire station for evaluation. His blood pressure was noted to be significantly elevated and it was recommended that he come to the emergency room. His chest discomfort is gone.  These episodes all occurred in the morning upon awakening. He has noted no discomfort with exertion. He has had no change in his exercise tolerance.  This morning's episode was also accompanied by dizziness which was like a lightheadedness prompting him to need to sit down.  About 3 weeks ago he had an episode of dizziness which sounds much more like vertigo.  He has had no syncope or presyncope.      Past Medical History  Diagnosis Date  . Hypertension   . GERD (gastroesophageal reflux disease)     uses Tums occ.  . Diverticulitis      Past Surgical History  Procedure Laterality Date  . Colon resection      diverticulitis  . Colonoscopy with propofol N/A 08/21/2014    Procedure: COLONOSCOPY WITH PROPOFOL;  Surgeon: Garlan Fair, MD;  Location: [facility];  Service: Endoscopy;  Laterality: N/A;      No current facility-administered medications for this encounter.    Current Outpatient Prescriptions  Medication Sig Dispense Refill  . amLODipine (NORVASC) 5 MG tablet Take 5 mg by mouth every morning.    . Ascorbic Acid (VITAMIN C) 1000 MG tablet Take 1,000 mg by mouth every morning.    Marland Kitchen aspirin 81 MG tablet Take 81 mg by mouth daily.    . Calcium-Magnesium-Vitamin D (CALCIUM 500 PO) Take 1 capsule by mouth every evening.    . Glucosamine 750 MG TABS Take 1 tablet by mouth every morning.    Marland Kitchen ibuprofen (ADVIL,MOTRIN) 200 MG tablet Take 400 mg by mouth every 8 (eight) hours as needed for headache or mild pain.    Marland Kitchen lisinopril (PRINIVIL,ZESTRIL) 40 MG tablet Take 40 mg by mouth every morning.    . loratadine (CLARITIN) 10 MG tablet Take 10 mg by mouth daily as needed for allergies.    . Multiple Vitamin (MULTIVITAMIN WITH MINERALS) TABS tablet Take 1 tablet by mouth every morning.    . Omega-3 Fatty Acids (OMEGA 3 PO) Take 1 tablet by mouth every morning.    . Probiotic Product (PROBIOTIC DAILY PO) Take 1 capsule by mouth daily.    . simvastatin (ZOCOR) 20 MG tablet Take 20 mg by mouth every evening.    . tadalafil (CIALIS) 5 MG tablet Take 5 mg by mouth every evening.    . fluocinonide cream (LIDEX) [id number] % Apply 1 application topically daily as needed (dry skin.).       Allergies: No Known Allergies   Social History  Substance  Use Topics  . Smoking status: Never Smoker   . Smokeless tobacco: Never Used  . Alcohol Use: No      No family history on file.    ROS:  Please see the history of present illness.     All other systems reviewed and negative.   Vital Signs: Blood pressure 142/81, pulse 61, temperature 98.1 F (36.7 C), temperature source Oral, resp. rate 16, SpO2 97 %.  PHYSICAL EXAM: General:  Well nourished, well developed male in no acute distress  HEENT: normal Lymph: no adenopathy Neck: no JVD Endocrine:  No thryomegaly Vascular: No carotid bruits; FA pulses 2+ bilaterally without bruits Cardiac:  normal S1, S2; RRR; no  murmur Back: without kyphosis/scoliosis, no CVA tenderness Lungs:  clear to auscultation bilaterally, no wheezing, rhonchi or rales Abd: soft, nontender, no hepatomegaly Ext: no edema Musculoskeletal:  No deformities, BUE and BLE strength normal and equal Skin: warm and dry Neuro:  CNs 2-12 intact, no focal abnormalities noted Psych:  Normal affect   EKG:   Sinus 69 with group beating w\hich should be wenckebach physiology but i cant demonstrate that so prob just bigeminy 14/15/46 rbbb/lafb  Labs:   Lab Results  Component Value Date   WBC 5.1 09/08/2015   HGB 15.4 09/08/2015   HCT 45.2 09/08/2015   MCV 91.3 09/08/2015   PLT 129* 09/08/2015    Recent Labs Lab 09/08/15 1006  NA 141  K 4.0  CL 109  CO2 25  BUN 13  CREATININE 0.95  CALCIUM 8.7*  GLUCOSE 111*   No results for input(s): CKTOTAL, CKMB, TROPONINI in the last 72 hours. 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 However, given the patient's dizziness his bifascicular block in the group meeting, I think overnight monitoring is appropriate. Hence, we will plan to  Admit Telemetry monitor Inpatient Myoview scan Continue antihypertensives Low-dose aspirin

## 2015-09-09 ENCOUNTER — Encounter (HOSPITAL_COMMUNITY): Payer: Self-pay | Admitting: Cardiology

## 2015-09-09 ENCOUNTER — Observation Stay (HOSPITAL_COMMUNITY): Payer: Commercial Managed Care - HMO

## 2015-09-09 DIAGNOSIS — R42 Dizziness and giddiness: Secondary | ICD-10-CM | POA: Diagnosis not present

## 2015-09-09 DIAGNOSIS — R072 Precordial pain: Secondary | ICD-10-CM | POA: Diagnosis not present

## 2015-09-09 DIAGNOSIS — R079 Chest pain, unspecified: Secondary | ICD-10-CM

## 2015-09-09 DIAGNOSIS — I452 Bifascicular block: Secondary | ICD-10-CM | POA: Diagnosis not present

## 2015-09-09 DIAGNOSIS — I10 Essential (primary) hypertension: Secondary | ICD-10-CM | POA: Diagnosis present

## 2015-09-09 HISTORY — DX: Dizziness and giddiness: R42

## 2015-09-09 HISTORY — DX: Essential (primary) hypertension: I10

## 2015-09-09 LAB — NM MYOCAR MULTI W/SPECT W/WALL MOTION / EF
CHL CUP NUCLEAR SRS: 3
CHL CUP NUCLEAR SSS: 3
CSEPHR: 100 %
CSEPPHR: 134 {beats}/min
Estimated workload: 10.4 METS
Exercise duration (min): 10 min
LHR: 0.33
LV dias vol: 104 mL
LVSYSVOL: 47 mL
Rest HR: 61 {beats}/min
SDS: 0
TID: 1.2

## 2015-09-09 MED ORDER — TECHNETIUM TC 99M SESTAMIBI GENERIC - CARDIOLITE
10.0000 | Freq: Once | INTRAVENOUS | Status: AC | PRN
  Administered 2015-09-09: 10 via INTRAVENOUS

## 2015-09-09 MED ORDER — AMLODIPINE BESYLATE 10 MG PO TABS: 10.0000 mg | ORAL_TABLET | Freq: Every day | ORAL | Status: DC

## 2015-09-09 MED ORDER — MAGNESIUM OXIDE -MG SUPPLEMENT 400 (240 MG) MG PO TABS: 400.0000 mg | ORAL_TABLET | Freq: Two times a day (BID) | ORAL | Status: DC

## 2015-09-09 MED ORDER — TECHNETIUM TC 99M SESTAMIBI - CARDIOLITE
30.0000 | Freq: Once | INTRAVENOUS | Status: AC | PRN
  Administered 2015-09-09: 30 via INTRAVENOUS

## 2015-09-09 NOTE — Progress Notes (Signed)
74 year old male no history of coronary disease point a history of bundle branch block who was had a stress test years ago by Dr. Rebecca Eaton who over the last series of months has had recurrent episodes of a.m. chest discomfort.  Also with dizziness and RBBB with LAFB concern for arrhthymias, for nuc study today.   Subjective: No pain  Objective: Vital signs in last 24 hours: Temp:  [97.8 F (36.6 C)-98.1 F (36.7 C)] 98 F (36.7 C) (11/14 0449) Pulse Rate:  [55-70] 60 (11/14 0449) Resp:  [13-22] 18 (11/14 0449) BP: (108-161)/(71-100) 130/83 mmHg (11/14 0449) SpO2:  [95 %-99 %] 98 % (11/14 0449) Weight:  [197 lb (89.359 kg)] 197 lb (89.359 kg) (11/13 1802) Weight change:  Last BM Date: 09/08/15 Intake/Output from previous day:  -497 11/13 0701 - 11/14 0700 In: 3 [I.V.:3] Out: 500 [Urine:500] Intake/Output this shift: Total I/O In: 0  Out: 300 [Urine:300]  PE: to be seen:  This is pending General:Pleasant affect, NAD Skin:Warm and dry, brisk capillary refill HEENT:normocephalic, sclera clear, mucus membranes moist Neck:supple, no JVD, no bruits  Heart:S1S2 RRR without murmur, gallup, rub or click Lungs:clear without rales, rhonchi, or wheezes [id number], non tender, + BS, do not palpate liver spleen or masses Ext:no lower ext edema, 2+ pedal pulses, 2+ radial pulses Neuro:alert and oriented, MAE, follows commands, + facial symmetry Tele:SR slight SB at night.    Lab Results:  Recent Labs  09/08/15 1006  WBC 5.1  HGB 15.4  HCT 45.2  PLT 129*   BMET  Recent Labs  09/08/15 1006  NA 141  K 4.0  CL 109  CO2 25  GLUCOSE 111*  BUN 13  CREATININE 0.95  CALCIUM 8.7*     Studies/Results: Dg Chest 2 View  09/08/2015  CLINICAL DATA:  Acute left chest pain, cough and congestion for 5 days. EXAM: CHEST  2 VIEW COMPARISON:  None available FINDINGS: Normal heart size and vascularity. No focal pneumonia, collapse or consolidation. Negative for edema, effusion or  pneumothorax. Trachea midline. Minor endplate degenerative changes of the thoracic spine. Slight central bronchitic change and interstitial prominence. IMPRESSION: Minor bronchitic change and central interstitial prominence. No focal pneumonia or edema. Electronically Signed   By: Jerilynn Mages.  Shick M.D.   On: 09/08/2015 11:29    Medications: I have reviewed the patient's current medications. Scheduled Meds: . amLODipine  5 mg Oral BH-q7a  . aspirin EC  81 mg Oral Daily  . aspirin  300 mg Rectal NOW  . lisinopril  40 mg Oral BH-q7a  . multivitamin with minerals  1 tablet Oral BH-q7a  . simvastatin  20 mg Oral QPM  . sodium chloride  3 mL Intravenous Q12H  . tadalafil  5 mg Oral QPM  . vitamin C  1,000 mg Oral BH-q7a   Continuous Infusions:  PRN Meds:.sodium chloride, acetaminophen, fluocinonide cream, ibuprofen, loratadine, nitroGLYCERIN, ondansetron (ZOFRAN) IV, sodium chloride  Assessment/Plan: Active Problems:   Chest pain for nuc today.   RBBB with left anterior fascicular block   Dizziness  Time spent with pt. :15 minutes. [facility]  Nurse Practitioner Certified Pager [phone] or after 5pm and on weekends call [phone] 09/09/2015, 9:17 AM   The patient was seen, examined and discussed with Cecilie Kicks, NP and I agree with the above.   74 year old male with h/o CAD presented with atypical chest pain associated with dizziness. His stress test today showed no scar or ischemia but  baseline HTN and hypertensive response to stress. I would increase his amlodipine to 10 mg po daily. Telemetry didn't show any arrhythmias. We will discharge. He should be evaluated for vertigo.   Dorothy Spark 09/09/2015

## 2015-09-09 NOTE — Care Management Obs Status (Signed)
[facility] NOTIFICATION   Patient Details  Name: [name] MRN: [id number] Date of Birth: 1940/11/19   Medicare Observation Status Notification Given:  Yes    Dawayne Patricia, RN 09/09/2015, 3:00 PM

## 2015-09-09 NOTE — Discharge Instructions (Signed)
Heart healthy diet.  We increased your amlodipine to 10 mg daily.   Call for further problems and we will have you follow up in 2 weeks.  See your primary doctor for vertigo.  We added magnesium for 1 week twice a day.   You had no arrhthymias with your dizzy spell here.

## 2015-09-09 NOTE — Discharge Summary (Signed)
Physician Discharge Summary       Patient ID: [name] MRN: [id number] DOB/AGE: [birth year] 74 y.o.  Admit date: 09/08/2015 Discharge date: 09/09/2015 Primary Cardiologist:Dr. Tamala Julian   Discharge Diagnoses:  Principal Problem:   Chest pain- negative stress test  Active Problems:   RBBB with left anterior fascicular block   Dizziness   Essential hypertension    Discharged Condition: good  Procedures: none  Hospital Course: 74 year old male with no hx of CAD but a RBBB.  Over last several months pt with chest discomfort.  5-15 min but vague.  On admit he had episode with HTN and came to ER.  On arrival he had no further chest pain.  He also related episode of dizziness with need to sit down. No syncope or presyncope.  He was kept overnight and scheduled for a exercise nuc study.   Additionally with his dizziness and bifascicular block he was monitored overnight.    His nuc study is negative for ischemia and no significant bradycardia. Dr. Meda Coffee felt his pain and dizziness was related HTN.  His amlodipine increased to 10 mg.  We recommend follow up with PCP for possible Vertigo.   Pt seen and found stable for discharge by Dr. Meda Coffee.   Consults: None  Significant Diagnostic Studies:  BMP Latest Ref Rng 09/08/2015  Glucose 65 - 99 mg/dL 111(H)  BUN 6 - 20 mg/dL 13  Creatinine 0.61 - 1.24 mg/dL 0.95  Sodium 135 - 145 mmol/L 141  Potassium 3.5 - 5.1 mmol/L 4.0  Chloride 101 - 111 mmol/L 109  CO2 22 - 32 mmol/L 25  Calcium 8.9 - 10.3 mg/dL 8.7(L)   CBC Latest Ref Rng 09/08/2015  WBC 4.0 - 10.5 K/uL 5.1  Hemoglobin 13.0 - 17.0 g/dL 15.4  Hematocrit 39.0 - 52.0 % 45.2  Platelets 150 - 400 K/uL 129(L)     Troponin 0.00 to 0.01.    CHEST 2 VIEW  COMPARISON: None available  FINDINGS: Normal heart size and vascularity. No focal pneumonia, collapse or consolidation. Negative for edema, effusion or pneumothorax. Trachea midline. Minor endplate degenerative  changes of the thoracic spine. Slight central bronchitic change and interstitial prominence.  IMPRESSION: Minor bronchitic change and central interstitial prominence.  No focal pneumonia or edema.   NUC Study:  There was no ST segment deviation noted during stress.  Defect 1: There is a small defect of mild severity.  The study is normal.  This is a low risk study.  The left ventricular ejection fraction is normal (55-65%).  Nuclear stress EF: 55%.  Low risk stress nuclear study with normal perfusion and normal left ventricular regional and global systolic function.   Discharge Exam: Blood pressure 164/75, pulse 90, temperature 98 F (36.7 C), temperature source Oral, resp. rate 18, height 5\' 5"  (1.651 m), weight 197 lb (89.359 kg), SpO2 98 %.  Disposition: 01-Home or Self Care     Medication List    TAKE these medications        amLODipine 10 MG tablet  Commonly known as:  NORVASC  Take 1 tablet (10 mg total) by mouth daily.     aspirin 81 MG tablet  Take 81 mg by mouth daily.     CALCIUM 500 PO  Take 1 capsule by mouth every evening.     fluocinonide cream 0.05 %  Commonly known as:  LIDEX  Apply 1 application topically daily as needed (dry skin.).     Glucosamine 750 MG Tabs  Take 1 tablet by mouth every morning.     ibuprofen 200 MG tablet  Commonly known as:  ADVIL,MOTRIN  Take 400 mg by mouth every 8 (eight) hours as needed for headache or mild pain.     lisinopril 40 MG tablet  Commonly known as:  PRINIVIL,ZESTRIL  Take 40 mg by mouth every morning.     loratadine 10 MG tablet  Commonly known as:  CLARITIN  Take 10 mg by mouth daily as needed for allergies.     multivitamin with minerals Tabs tablet  Take 1 tablet by mouth every morning.     OMEGA 3 PO  Take 1 tablet by mouth every morning.     PROBIOTIC DAILY PO  Take 1 capsule by mouth daily.     simvastatin 20 MG tablet  Commonly known as:  ZOCOR  Take 20 mg by mouth every  evening.     tadalafil 5 MG tablet  Commonly known as:  CIALIS  Take 5 mg by mouth every evening.     vitamin C 1000 MG tablet  Take 1,000 mg by mouth every morning.       Follow-up Information    Follow up with Sinclair Grooms, MD On 09/23/2015.   Specialty:  Cardiology   Why:  at 2:30 pm with Ellen Henri, PA   Contact information:   [place] [address] [phone]       Schedule an appointment as soon as possible for a visit with Mayra Neer, MD.   Specialty:  Family Medicine   Why:  to be evaluated for vertigo.   Contact information:   [address] [place] [address] [phone]        Discharge Instructions: Heart healthy diet.  We increased your amlodipine to 10 mg daily.   Call for further problems and we will have you follow up in 2 weeks.  See your primary doctor for vertigo.    Signed: Isaiah Serge Nurse Practitioner-Certified [facility]: [facility] 09/09/2015, 4:27 PM  Time spent on discharge : > 30 minutes.

## 2015-09-09 NOTE — Progress Notes (Signed)
Patient in no distress or pain.  Wife at bedside. Call light within reach.

## 2015-09-09 NOTE — Progress Notes (Signed)
Patient discharged home with wife. Medications were reviewed and he was able to teach back. IV was taken out and intact. Patient taken to main entrance by RN.

## 2015-09-23 ENCOUNTER — Ambulatory Visit (INDEPENDENT_AMBULATORY_CARE_PROVIDER_SITE_OTHER): Payer: Commercial Managed Care - HMO | Admitting: Cardiology

## 2015-09-23 ENCOUNTER — Encounter: Payer: Self-pay | Admitting: Cardiology

## 2015-09-23 VITALS — BP 130/76 | HR 76 | Ht 65.0 in | Wt 202.1 lb

## 2015-09-23 DIAGNOSIS — I10 Essential (primary) hypertension: Secondary | ICD-10-CM | POA: Diagnosis not present

## 2015-09-23 NOTE — Patient Instructions (Signed)
Medication Instructions:  Your physician recommends that you continue on your current medications as directed. Please refer to the Current Medication list given to you today.   Labwork: None ordered  Testing/Procedures: None ordered  Follow-Up: Your physician wants you to follow-up in: 6 months with Dr. Smith. You will receive a reminder letter in the mail two months in advance. If you don't receive a letter, please call our office to schedule the follow-up appointment.   Any Other Special Instructions Will Be Listed Below (If Applicable).     If you need a refill on your cardiac medications before your next appointment, please call your pharmacy.   

## 2015-09-23 NOTE — Progress Notes (Signed)
09/23/2015 [name]   Feb 21, 1941  [id number]  Primary Physician Mayra Neer, MD Primary Cardiologist: Dr. Tamala Julian   Reason for Visit/CC:  [facility] f/u for CP and dizziness; HTN   HPI: 74 y/o male with a h/o HTN and chronic RBBB but no h/o CAD, who presents to clinic for post hospital f/u. He was admittted 09/08/15 for chest pain and dizziness. On arrival to the ED, he was noted to be hypertensive with SBP in the 190s. He was admitted for evaluation. Cardiac enzymes were negative. He underwent a NST which was negative for ischemia. EF was normal at 55%. No significant bradycardia or arrthymias were noted on telemetry. He was seen by Dr. Meda Coffee who felt that his pain and dizziness were secondary to his HTN. It was also felt that he had possible vertigo as well. His amlodipine was increased to 10 mg daily for BP control and he was instructed to f/u with his PCP.   He presents to clinic for post hospital f/u. He reports that he has done well and denies any recurrent CP and no further dizziness. His BP is well controlled today in the [id number] systolic. He reports full medication compliance.      Current Outpatient Prescriptions  Medication Sig Dispense Refill  . amLODipine (NORVASC) 10 MG tablet Take 1 tablet (10 mg total) by mouth daily. 30 tablet 6  . Ascorbic Acid (VITAMIN C) 1000 MG tablet Take 1,000 mg by mouth every morning.    Marland Kitchen aspirin 81 MG tablet Take 81 mg by mouth daily.    . Calcium-Magnesium-Vitamin D (CALCIUM 500 PO) Take 1 capsule by mouth every evening.    . fluocinonide cream (LIDEX) [id number] % Apply 1 application topically daily as needed (dry skin.).    Marland Kitchen Glucosamine 750 MG TABS Take 1 tablet by mouth every morning.    Marland Kitchen ibuprofen (ADVIL,MOTRIN) 200 MG tablet Take 400 mg by mouth every 8 (eight) hours as needed for headache or mild pain.    Marland Kitchen lisinopril (PRINIVIL,ZESTRIL) 40 MG tablet Take 40 mg by mouth every morning.    . loratadine (CLARITIN) 10 MG tablet Take  10 mg by mouth daily as needed for allergies.    . Magnesium Oxide 400 (240 MG) MG TABS Take 400 mg by mouth 2 (two) times daily. 14 tablet 0  . Multiple Vitamin (MULTIVITAMIN WITH MINERALS) TABS tablet Take 1 tablet by mouth every morning.    . Omega-3 Fatty Acids (OMEGA 3 PO) Take 1 tablet by mouth every morning.    . Probiotic Product (PROBIOTIC DAILY PO) Take 1 capsule by mouth daily.    . simvastatin (ZOCOR) 20 MG tablet Take 20 mg by mouth every evening.    . tadalafil (CIALIS) 5 MG tablet Take 5 mg by mouth every evening.     No current facility-administered medications for this visit.    No Known Allergies  Social History   Social History  . Marital Status: Married    Spouse Name: N/A  . Number of Children: N/A  . Years of Education: N/A   Occupational History  . Not on file.   Social History Main Topics  . Smoking status: Never Smoker   . Smokeless tobacco: Never Used  . Alcohol Use: No  . Drug Use: No  . Sexual Activity: Not on file   Other Topics Concern  . Not on file   Social History Narrative     Review of Systems: General: negative for chills, fever, night  sweats or weight changes.  Cardiovascular: negative for chest pain, dyspnea on exertion, edema, orthopnea, palpitations, paroxysmal nocturnal dyspnea or shortness of breath Dermatological: negative for rash Respiratory: negative for cough or wheezing Urologic: negative for hematuria Abdominal: negative for nausea, vomiting, diarrhea, bright red blood per rectum, melena, or hematemesis Neurologic: negative for visual changes, syncope, or dizziness All other systems reviewed and are otherwise negative except as noted above.    Blood pressure 130/76, pulse 76, height 5\' 5"  (1.651 m), weight 202 lb 1.9 oz (91.681 kg), SpO2 96 %.  General appearance: alert, cooperative and no distress Neck: no carotid bruit and no JVD Lungs: clear to auscultation bilaterally Heart: regular rate and rhythm, S1, S2  normal, no murmur, click, rub or gallop Extremities: no LEE Pulses: 2+ and symmetric Skin: warm and dry Neurologic: Grossly normal  EKG Not performed  ASSESSMENT AND PLAN:   1. Chest Pain: low risk NST with normal LVF. Felt secondary to HTN which is now better controlled after increasing amlodipine. He denies any further CP. Continue antihypertensives and daily ASA.  2. HTN: well controlled after increase in amlodipine to 10 mg. Continue lisinopril as well.  3. Dizziness: resolved with improvement in BP. He denies any further recurrence.    PLAN  Continue current medications. F/u with Dr. Tamala Julian in 6 months.   [name] PA-C 09/23/2015 3:02 PM

## 2015-10-28 DIAGNOSIS — H1032 Unspecified acute conjunctivitis, left eye: Secondary | ICD-10-CM | POA: Diagnosis not present

## 2015-12-10 DIAGNOSIS — M7989 Other specified soft tissue disorders: Secondary | ICD-10-CM | POA: Diagnosis not present

## 2015-12-10 DIAGNOSIS — L299 Pruritus, unspecified: Secondary | ICD-10-CM | POA: Diagnosis not present

## 2016-01-23 DIAGNOSIS — H524 Presbyopia: Secondary | ICD-10-CM | POA: Diagnosis not present

## 2016-01-23 DIAGNOSIS — D3132 Benign neoplasm of left choroid: Secondary | ICD-10-CM | POA: Diagnosis not present

## 2016-01-23 DIAGNOSIS — H2513 Age-related nuclear cataract, bilateral: Secondary | ICD-10-CM | POA: Diagnosis not present

## 2016-01-23 DIAGNOSIS — H43813 Vitreous degeneration, bilateral: Secondary | ICD-10-CM | POA: Diagnosis not present

## 2016-03-25 ENCOUNTER — Encounter: Payer: Self-pay | Admitting: Interventional Cardiology

## 2016-03-26 NOTE — Progress Notes (Signed)
Cardiology Office Note    Date:  03/27/2016   ID:  [name], DOB [birth year], MRN [id number]  PCP:  Mayra Neer, MD  Cardiologist: Sinclair Grooms, MD   Chief Complaint  Patient presents with  . Follow-up    Htn    History of Present Illness:  [name] is a 75 y.o. 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 Relatively recent nuclear study was nonischemic.  He was hospitalized in November 2016 with elevated blood pressure. Several combinations of medications or been used since that time. During the hospital stay a nuclear perfusion study was performed and was low risk. He is now on a medication regimen that is listed below. Following concern is for the 25 mg of hydrochlorothiazide. The patient has adjusted the verapamil down from 240-120 mg per day by taking half a tablet. He feels back to normal. Dr. Serita Grammes identified that he was having an interaction with his medications due to simvastatin. That medication was discontinued and atorvastatin was started. He now feels back to normal. Blood pressure recordings from home have all been less than 140/90 mmHg.  Past Medical History  Diagnosis Date  . Hypertension   . GERD (gastroesophageal reflux disease)     uses Tums occ.  . Diverticulitis   . Dizziness 09/09/2015  . RBBB with left anterior fascicular block   . Benign essential HTN 09/09/2015    Past Surgical History  Procedure Laterality Date  . Colon resection      diverticulitis  . Colonoscopy with propofol N/A 08/21/2014    Procedure: COLONOSCOPY WITH PROPOFOL;  Surgeon: Garlan Fair, MD;  Location: [facility];  Service: Endoscopy;  Laterality: N/A;    Current Medications: Outpatient Prescriptions Prior to Visit  Medication Sig Dispense Refill  .  Ascorbic Acid (VITAMIN C) 1000 MG tablet Take 1,000 mg by mouth every morning.    Marland Kitchen aspirin 81 MG tablet Take 81 mg by mouth daily.    . Glucosamine 750 MG TABS Take 1 tablet by mouth every morning.    Marland Kitchen ibuprofen (ADVIL,MOTRIN) 200 MG tablet Take 400 mg by mouth every 8 (eight) hours as needed for headache or mild pain.    Marland Kitchen lisinopril (PRINIVIL,ZESTRIL) 40 MG tablet Take 40 mg by mouth every morning.    . loratadine (CLARITIN) 10 MG tablet Take 10 mg by mouth daily as needed for allergies.    . Magnesium Oxide 400 (240 MG) MG TABS Take 400 mg by mouth 2 (two) times daily. 14 tablet 0  . Multiple Vitamin (MULTIVITAMIN WITH MINERALS) TABS tablet Take 1 tablet by mouth every morning.    . Omega-3 Fatty Acids (OMEGA 3 PO) Take 1 tablet by mouth every morning.    . Probiotic Product (PROBIOTIC DAILY PO) Take 1 capsule by mouth daily.    . tadalafil (CIALIS) 5 MG tablet Take 5 mg by mouth every evening.    Marland Kitchen amLODipine (NORVASC) 10 MG tablet Take 1 tablet (10 mg total) by mouth daily. 30 tablet 6  . Calcium-Magnesium-Vitamin D (CALCIUM 500 PO) Take 1 capsule by mouth every evening.    . fluocinonide cream (LIDEX) [id number] % Apply 1 application topically daily as needed (dry skin.).    Marland Kitchen simvastatin (ZOCOR) 20 MG tablet Take 20 mg by mouth every evening.     No facility-administered medications prior to  visit.     Allergies:   Review of patient's allergies indicates no known allergies.   Social History   Social History  . Marital Status: Married    Spouse Name: N/A  . Number of Children: N/A  . Years of Education: N/A   Social History Main Topics  . Smoking status: Never Smoker   . Smokeless tobacco: Never Used  . Alcohol Use: No  . Drug Use: No  . Sexual Activity: Not Asked   Other Topics Concern  . None   Social History Narrative     Family History:  The patient's family history includes Breast cancer in his mother; Heart attack in his father; Heart disease in his father.   ROS:    Please see the history of present illness.    He developed peripheral edema on 10 mg of amlodipine. He had leg pains, aches, and did not feel well until simvastatin was discontinued and atorvastatin started.  All other systems reviewed and are negative.   PHYSICAL EXAM:   VS:  BP 110/70 mmHg  Pulse 66  Ht 5\' 10"  (1.778 m)  Wt 195 lb 12.8 oz (88.814 kg)  BMI 28.09 kg/m2   GEN: Well nourished, well developed, in no acute distress HEENT: normal Neck: no JVD, carotid bruits, or masses Cardiac: RRR; no murmurs, rubs, or gallops,no edema  Respiratory:  clear to auscultation bilaterally, normal work of breathing GI: soft, nontender, nondistended, + BS MS: no deformity or atrophy Skin: warm and dry, no rash Neuro:  Alert and Oriented x 3, Strength and sensation are intact Psych: euthymic mood, full affect  Wt Readings from Last 3 Encounters:  03/27/16 195 lb 12.8 oz (88.814 kg)  09/23/15 202 lb 1.9 oz (91.681 kg)  09/08/15 197 lb (89.359 kg)      Studies/Labs Reviewed:   EKG:  EKG  Is not repeated  Recent Labs: 09/08/2015: BUN 13; Creatinine, Ser 0.95; Hemoglobin 15.4; Platelets 129*; Potassium 4.0; Sodium 141   Lipid Panel No results found for: CHOL, TRIG, HDL, CHOLHDL, VLDL, LDLCALC, LDLDIRECT  Additional studies/ records that were reviewed today include:  Nuclear perfusion imaging on the heart from 09/09/2015:  Study Result      There was no ST segment deviation noted during stress.  Defect 1: There is a small defect of mild severity.  The study is normal.  This is a low risk study.  The left ventricular ejection fraction is normal (55-65%).  Nuclear stress EF: 55%.  Low risk stress nuclear study with normal perfusion and normal left ventricular regional and global systolic function.     ASSESSMENT:    1. Hypertension, essential   2. RBBB   3. Chest pain, unspecified chest pain type      PLAN:  In order of problems listed above:   The blood  pressure is much better controlled. I have recommended decreasing HCTZ to 12.5 mg per day and increasing verapamil SR back to 240 mg per day as prescribed by Dr. Brigitte Pulse. Continue to monitor blood pressures at home. Forward recordings to both Dr. Brigitte Pulse and myself. No other adjustments have been made. Plan to see the patient on an as-needed basis. Dr. Brigitte Pulse has done an excellent job of blood pressure control.    Medication Adjustments/Labs and Tests Ordered: Current medicines are reviewed at length with the patient today.  Concerns regarding medicines are outlined above.  Medication changes, Labs and Tests ordered today are listed in the Patient Instructions below. Patient Instructions  Medication Instructions:  Your physician has recommended you make the following change in your medication:  Decrease hydrochlorothiazide to 12.5 mg by mouth daily. Increase Verapamil back to 240 mg daily.   Labwork: none  Testing/Procedures: none  Follow-Up: Your physician recommends that you schedule a follow-up appointment as needed with Dr. Tamala Julian  Check blood pressure for 2-3 weeks and call our office and Dr. Raul Del office with these readings.     Any Other Special Instructions Will Be Listed Below (If Applicable).     If you need a refill on your cardiac medications before your next appointment, please call your pharmacy.       Signed, Sinclair Grooms, MD  03/27/2016 2:00 PM    [address], [place], [place]  [address] Phone: [phone]; Fax: [phone]

## 2016-03-27 ENCOUNTER — Encounter: Payer: Self-pay | Admitting: Interventional Cardiology

## 2016-03-27 ENCOUNTER — Ambulatory Visit (INDEPENDENT_AMBULATORY_CARE_PROVIDER_SITE_OTHER): Payer: PPO | Admitting: Interventional Cardiology

## 2016-03-27 VITALS — BP 110/70 | HR 66 | Ht 70.0 in | Wt 195.8 lb

## 2016-03-27 DIAGNOSIS — R079 Chest pain, unspecified: Secondary | ICD-10-CM | POA: Diagnosis not present

## 2016-03-27 DIAGNOSIS — I10 Essential (primary) hypertension: Secondary | ICD-10-CM

## 2016-03-27 DIAGNOSIS — I4510 Unspecified right bundle-branch block: Secondary | ICD-10-CM

## 2016-03-27 MED ORDER — HYDROCHLOROTHIAZIDE 12.5 MG PO CAPS: 12.5000 mg | ORAL_CAPSULE | Freq: Every day | ORAL | Status: DC

## 2016-03-27 MED ORDER — VERAPAMIL HCL ER 240 MG PO TBCR: 240.0000 mg | EXTENDED_RELEASE_TABLET | Freq: Every day | ORAL | Status: AC

## 2016-03-27 NOTE — Patient Instructions (Signed)
Medication Instructions:  Your physician has recommended you make the following change in your medication:  Decrease hydrochlorothiazide to 12.5 mg by mouth daily. Increase Verapamil back to 240 mg daily.   Labwork: none  Testing/Procedures: none  Follow-Up: Your physician recommends that you schedule a follow-up appointment as needed with Dr. Tamala Julian  Check blood pressure for 2-3 weeks and call our office and Dr. Raul Del office with these readings.     Any Other Special Instructions Will Be Listed Below (If Applicable).     If you need a refill on your cardiac medications before your next appointment, please call your pharmacy.

## 2016-06-01 DIAGNOSIS — H16202 Unspecified keratoconjunctivitis, left eye: Secondary | ICD-10-CM | POA: Diagnosis not present

## 2016-06-02 DIAGNOSIS — H16202 Unspecified keratoconjunctivitis, left eye: Secondary | ICD-10-CM | POA: Diagnosis not present

## 2016-06-08 DIAGNOSIS — H16202 Unspecified keratoconjunctivitis, left eye: Secondary | ICD-10-CM | POA: Diagnosis not present

## 2016-06-24 DIAGNOSIS — H2513 Age-related nuclear cataract, bilateral: Secondary | ICD-10-CM | POA: Diagnosis not present

## 2016-06-24 DIAGNOSIS — H43813 Vitreous degeneration, bilateral: Secondary | ICD-10-CM | POA: Diagnosis not present

## 2016-06-24 DIAGNOSIS — H524 Presbyopia: Secondary | ICD-10-CM | POA: Diagnosis not present

## 2016-06-24 DIAGNOSIS — D3132 Benign neoplasm of left choroid: Secondary | ICD-10-CM | POA: Diagnosis not present

## 2016-07-31 DIAGNOSIS — Z23 Encounter for immunization: Secondary | ICD-10-CM | POA: Diagnosis not present

## 2016-09-21 DIAGNOSIS — M150 Primary generalized (osteo)arthritis: Secondary | ICD-10-CM | POA: Diagnosis not present

## 2016-09-21 DIAGNOSIS — Z125 Encounter for screening for malignant neoplasm of prostate: Secondary | ICD-10-CM | POA: Diagnosis not present

## 2016-09-21 DIAGNOSIS — N529 Male erectile dysfunction, unspecified: Secondary | ICD-10-CM | POA: Diagnosis not present

## 2016-09-21 DIAGNOSIS — N400 Enlarged prostate without lower urinary tract symptoms: Secondary | ICD-10-CM | POA: Diagnosis not present

## 2016-09-21 DIAGNOSIS — E782 Mixed hyperlipidemia: Secondary | ICD-10-CM | POA: Diagnosis not present

## 2016-09-21 DIAGNOSIS — I10 Essential (primary) hypertension: Secondary | ICD-10-CM | POA: Diagnosis not present

## 2016-09-21 DIAGNOSIS — Z0000 Encounter for general adult medical examination without abnormal findings: Secondary | ICD-10-CM | POA: Diagnosis not present

## 2016-10-09 DIAGNOSIS — R945 Abnormal results of liver function studies: Secondary | ICD-10-CM | POA: Diagnosis not present

## 2016-10-27 DIAGNOSIS — Z85828 Personal history of other malignant neoplasm of skin: Secondary | ICD-10-CM | POA: Diagnosis not present

## 2016-10-27 DIAGNOSIS — D1721 Benign lipomatous neoplasm of skin and subcutaneous tissue of right arm: Secondary | ICD-10-CM | POA: Diagnosis not present

## 2016-10-27 DIAGNOSIS — L918 Other hypertrophic disorders of the skin: Secondary | ICD-10-CM | POA: Diagnosis not present

## 2016-10-27 DIAGNOSIS — D225 Melanocytic nevi of trunk: Secondary | ICD-10-CM | POA: Diagnosis not present

## 2016-10-27 DIAGNOSIS — L821 Other seborrheic keratosis: Secondary | ICD-10-CM | POA: Diagnosis not present

## 2016-10-27 DIAGNOSIS — D2272 Melanocytic nevi of left lower limb, including hip: Secondary | ICD-10-CM | POA: Diagnosis not present

## 2016-10-27 DIAGNOSIS — L570 Actinic keratosis: Secondary | ICD-10-CM | POA: Diagnosis not present

## 2016-10-27 DIAGNOSIS — D1801 Hemangioma of skin and subcutaneous tissue: Secondary | ICD-10-CM | POA: Diagnosis not present

## 2016-12-24 DIAGNOSIS — R972 Elevated prostate specific antigen [PSA]: Secondary | ICD-10-CM | POA: Diagnosis not present

## 2017-02-09 DIAGNOSIS — R972 Elevated prostate specific antigen [PSA]: Secondary | ICD-10-CM | POA: Diagnosis not present

## 2017-02-09 DIAGNOSIS — R350 Frequency of micturition: Secondary | ICD-10-CM | POA: Diagnosis not present

## 2017-03-01 DIAGNOSIS — M25561 Pain in right knee: Secondary | ICD-10-CM | POA: Diagnosis not present

## 2017-03-04 ENCOUNTER — Encounter (INDEPENDENT_AMBULATORY_CARE_PROVIDER_SITE_OTHER): Payer: Self-pay | Admitting: Orthopaedic Surgery

## 2017-03-04 ENCOUNTER — Ambulatory Visit (INDEPENDENT_AMBULATORY_CARE_PROVIDER_SITE_OTHER): Payer: Self-pay | Admitting: Orthopaedic Surgery

## 2017-03-04 ENCOUNTER — Ambulatory Visit (INDEPENDENT_AMBULATORY_CARE_PROVIDER_SITE_OTHER): Payer: PPO

## 2017-03-04 DIAGNOSIS — G8929 Other chronic pain: Secondary | ICD-10-CM | POA: Diagnosis not present

## 2017-03-04 DIAGNOSIS — M25561 Pain in right knee: Secondary | ICD-10-CM | POA: Diagnosis not present

## 2017-03-04 NOTE — Progress Notes (Signed)
Office Visit Note   Patient: [name]           Date of Birth: 29-Jan-1941           MRN: [id number] Visit Date: 03/04/2017              Requested by: Mayra Neer, MD [address] [place], [place] [address] PCP: Mayra Neer, MD   Assessment & Plan: Visit Diagnoses:  1. Chronic pain of right knee     Plan: Impression is osteoarthritis flareup. Right knee injection was performed. Follow-up as needed.  Follow-Up Instructions: Return if symptoms worsen or fail to improve.   Orders:  Orders Placed This Encounter  Procedures  . XR KNEE 3 VIEW RIGHT   No orders of the defined types were placed in this encounter.     Procedures: Large Joint Inj Date/Time: 03/04/2017 11:33 AM Performed by: Leandrew Koyanagi Authorized by: Leandrew Koyanagi   Consent Given by:  Patient Timeout: prior to procedure the correct patient, procedure, and site was verified   Indications:  Pain Location:  Knee Site:  R knee Prep: patient was prepped and draped in usual sterile fashion   Needle Size:  22 G Ultrasound Guidance: No   Fluoroscopic Guidance: No   Arthrogram: No   Patient tolerance:  Patient tolerated the procedure well with no immediate complications     Clinical Data: No additional findings.   Subjective: Chief Complaint  Patient presents with  . Right Knee - Pain    Patient is a 76 year old gentleman who comes in with right knee pain for a couple weeks. He denies any injuries. The pain radiates up and down from his knee. Please does not help. The pain is worse at night. He also has start up pain. Denies any mechanical symptoms or back pain.    Review of Systems  Constitutional: Negative.   All other systems reviewed and are negative.    Objective: Vital Signs: There were no vitals taken for this visit.  Physical Exam  Constitutional: He is oriented to person, place, and time. He appears well-developed and well-nourished.  HENT:  Head:  Normocephalic and atraumatic.  Eyes: Pupils are equal, round, and reactive to light.  Neck: Neck supple.  Pulmonary/Chest: Effort normal.  Abdominal: Soft.  Musculoskeletal: Normal range of motion.  Neurological: He is alert and oriented to person, place, and time.  Skin: Skin is warm.  Psychiatric: He has a normal mood and affect. His behavior is normal. Judgment and thought content normal.  Nursing note and vitals reviewed.   Ortho Exam Right knee exam shows no joint effusion.  Good range of motion. Collaterals and cruciates are stable. No patellar crepitus. Specialty Comments:  No specialty comments available.  Imaging: Xr Knee 3 View Right  Result Date: 03/04/2017 Mild osteoarthritis    PMFS History: Patient Active Problem List   Diagnosis Date Noted  . RBBB with left anterior fascicular block 09/09/2015  . Dizziness 09/09/2015  . Benign essential HTN 09/09/2015  . Chest pain 09/08/2015   Past Medical History:  Diagnosis Date  . Benign essential HTN 09/09/2015  . Diverticulitis   . Dizziness 09/09/2015  . GERD (gastroesophageal reflux disease)    uses Tums occ.  Marland Kitchen Hypertension   . RBBB with left anterior fascicular block     Family History  Problem Relation Age of Onset  . Breast cancer Mother   . Heart disease Father   . Heart attack Father  Past Surgical History:  Procedure Laterality Date  . COLON RESECTION     diverticulitis  . COLONOSCOPY WITH PROPOFOL N/A 08/21/2014   Procedure: COLONOSCOPY WITH PROPOFOL;  Surgeon: Garlan Fair, MD;  Location: [facility];  Service: Endoscopy;  Laterality: N/A;   Social History   Occupational History  . Not on file.   Social History Main Topics  . Smoking status: Never Smoker  . Smokeless tobacco: Never Used  . Alcohol use No  . Drug use: No  . Sexual activity: Not on file

## 2017-03-05 ENCOUNTER — Telehealth (INDEPENDENT_AMBULATORY_CARE_PROVIDER_SITE_OTHER): Payer: Self-pay | Admitting: Orthopaedic Surgery

## 2017-03-05 NOTE — Telephone Encounter (Signed)
Patient's wife called stating that her husband had a really bad night last night.  She is wanting to know when should they being seeing some results from the shot he received.  CB#[phone].  Thank you.

## 2017-03-08 NOTE — Telephone Encounter (Signed)
It can take up to 2 weeks to feel the effects

## 2017-03-08 NOTE — Telephone Encounter (Signed)
See message below °

## 2017-03-09 MED ORDER — TRAMADOL HCL 50 MG PO TABS: 50.0000 mg | ORAL_TABLET | Freq: Three times a day (TID) | ORAL | 0 refills | Status: AC | PRN

## 2017-03-09 NOTE — Telephone Encounter (Signed)
Called in Rx into pharm pt aware.

## 2017-03-09 NOTE — Telephone Encounter (Signed)
Tramadol #30

## 2017-03-09 NOTE — Telephone Encounter (Signed)
Called patient to advise and he would like to know if there is something he can get for pain. He is currently taking tylenol/ibuprofen. Please advise.

## 2017-04-20 DIAGNOSIS — R972 Elevated prostate specific antigen [PSA]: Secondary | ICD-10-CM | POA: Diagnosis not present

## 2017-06-02 DIAGNOSIS — H1131 Conjunctival hemorrhage, right eye: Secondary | ICD-10-CM | POA: Diagnosis not present

## 2017-06-15 DIAGNOSIS — R972 Elevated prostate specific antigen [PSA]: Secondary | ICD-10-CM | POA: Diagnosis not present

## 2017-06-30 DIAGNOSIS — H2513 Age-related nuclear cataract, bilateral: Secondary | ICD-10-CM | POA: Diagnosis not present

## 2017-06-30 DIAGNOSIS — H524 Presbyopia: Secondary | ICD-10-CM | POA: Diagnosis not present

## 2017-06-30 DIAGNOSIS — H43813 Vitreous degeneration, bilateral: Secondary | ICD-10-CM | POA: Diagnosis not present

## 2017-06-30 DIAGNOSIS — D3132 Benign neoplasm of left choroid: Secondary | ICD-10-CM | POA: Diagnosis not present

## 2017-10-07 DIAGNOSIS — R972 Elevated prostate specific antigen [PSA]: Secondary | ICD-10-CM | POA: Diagnosis not present

## 2017-10-07 DIAGNOSIS — E782 Mixed hyperlipidemia: Secondary | ICD-10-CM | POA: Diagnosis not present

## 2017-10-07 DIAGNOSIS — M150 Primary generalized (osteo)arthritis: Secondary | ICD-10-CM | POA: Diagnosis not present

## 2017-10-07 DIAGNOSIS — Z125 Encounter for screening for malignant neoplasm of prostate: Secondary | ICD-10-CM | POA: Diagnosis not present

## 2017-10-07 DIAGNOSIS — N400 Enlarged prostate without lower urinary tract symptoms: Secondary | ICD-10-CM | POA: Diagnosis not present

## 2017-10-07 DIAGNOSIS — I10 Essential (primary) hypertension: Secondary | ICD-10-CM | POA: Diagnosis not present

## 2017-10-07 DIAGNOSIS — Z0000 Encounter for general adult medical examination without abnormal findings: Secondary | ICD-10-CM | POA: Diagnosis not present

## 2017-10-07 DIAGNOSIS — N529 Male erectile dysfunction, unspecified: Secondary | ICD-10-CM | POA: Diagnosis not present

## 2017-10-08 ENCOUNTER — Ambulatory Visit (INDEPENDENT_AMBULATORY_CARE_PROVIDER_SITE_OTHER): Payer: PPO | Admitting: Orthopaedic Surgery

## 2017-10-08 DIAGNOSIS — M25561 Pain in right knee: Secondary | ICD-10-CM | POA: Diagnosis not present

## 2017-10-08 DIAGNOSIS — G8929 Other chronic pain: Secondary | ICD-10-CM

## 2017-10-08 MED ORDER — METHYLPREDNISOLONE ACETATE 40 MG/ML IJ SUSP
40.0000 mg | INTRAMUSCULAR | Status: AC | PRN
  Administered 2017-10-08: 40 mg via INTRA_ARTICULAR

## 2017-10-08 MED ORDER — LIDOCAINE HCL 1 % IJ SOLN
2.0000 mL | INTRAMUSCULAR | Status: AC | PRN
  Administered 2017-10-08: 2 mL

## 2017-10-08 MED ORDER — BUPIVACAINE HCL 0.5 % IJ SOLN
2.0000 mL | INTRAMUSCULAR | Status: AC | PRN
  Administered 2017-10-08: 2 mL via INTRA_ARTICULAR

## 2017-10-08 NOTE — Progress Notes (Signed)
   Office Visit Note   Patient: [name]           Date of Birth: 1941/07/27           MRN: [id number] Visit Date: 10/08/2017              Requested by: Mayra Neer, MD [address] [place], [place] [address] PCP: Mayra Neer, MD   Assessment & Plan: Visit Diagnoses:  1. Chronic pain of right knee     Plan: Cortisone injection performed for the right knee today.  Patient tolerated well.  Follow-up as needed.  Follow-Up Instructions: Return if symptoms worsen or fail to improve.   Orders:  No orders of the defined types were placed in this encounter.  No orders of the defined types were placed in this encounter.     Procedures: Large Joint Inj: R knee on 10/08/2017 2:10 PM Indications: pain Details: 22 G needle  Arthrogram: No  Medications: 40 mg methylPREDNISolone acetate 40 MG/ML; 2 mL lidocaine 1 %; 2 mL bupivacaine 0.5 % Consent was given by the patient. Patient was prepped and draped in the usual sterile fashion.       Clinical Data: No additional findings.   Subjective: No chief complaint on file.   Patient comes back today for follow-up of right knee pain.  He had a previous injection in May and had good relief.  He is requesting another injection today.      Review of Systems   Objective: Vital Signs: There were no vitals taken for this visit.  Physical Exam  Ortho Exam Right knee exam is stable.  No joint effusion. Specialty Comments:  No specialty comments available.  Imaging: No results found.   PMFS History: Patient Active Problem List   Diagnosis Date Noted  . RBBB with left anterior fascicular block 09/09/2015  . Dizziness 09/09/2015  . Benign essential HTN 09/09/2015  . Chest pain 09/08/2015   Past Medical History:  Diagnosis Date  . Benign essential HTN 09/09/2015  . Diverticulitis   . Dizziness 09/09/2015  . GERD (gastroesophageal reflux disease)    uses Tums occ.  Marland Kitchen Hypertension   .  RBBB with left anterior fascicular block     Family History  Problem Relation Age of Onset  . Breast cancer Mother   . Heart disease Father   . Heart attack Father     Past Surgical History:  Procedure Laterality Date  . COLON RESECTION     diverticulitis  . COLONOSCOPY WITH PROPOFOL N/A 08/21/2014   Procedure: COLONOSCOPY WITH PROPOFOL;  Surgeon: Garlan Fair, MD;  Location: [facility];  Service: Endoscopy;  Laterality: N/A;   Social History   Occupational History  . Not on file  Tobacco Use  . Smoking status: Never Smoker  . Smokeless tobacco: Never Used  Substance and Sexual Activity  . Alcohol use: No  . Drug use: No  . Sexual activity: Not on file

## 2017-12-21 DIAGNOSIS — D225 Melanocytic nevi of trunk: Secondary | ICD-10-CM | POA: Diagnosis not present

## 2017-12-21 DIAGNOSIS — Z85828 Personal history of other malignant neoplasm of skin: Secondary | ICD-10-CM | POA: Diagnosis not present

## 2017-12-21 DIAGNOSIS — D1721 Benign lipomatous neoplasm of skin and subcutaneous tissue of right arm: Secondary | ICD-10-CM | POA: Diagnosis not present

## 2017-12-21 DIAGNOSIS — L821 Other seborrheic keratosis: Secondary | ICD-10-CM | POA: Diagnosis not present

## 2017-12-21 DIAGNOSIS — L570 Actinic keratosis: Secondary | ICD-10-CM | POA: Diagnosis not present

## 2018-10-21 DIAGNOSIS — Z0000 Encounter for general adult medical examination without abnormal findings: Secondary | ICD-10-CM | POA: Diagnosis not present

## 2018-10-21 DIAGNOSIS — E782 Mixed hyperlipidemia: Secondary | ICD-10-CM | POA: Diagnosis not present

## 2018-10-21 DIAGNOSIS — I10 Essential (primary) hypertension: Secondary | ICD-10-CM | POA: Diagnosis not present

## 2018-10-21 DIAGNOSIS — N529 Male erectile dysfunction, unspecified: Secondary | ICD-10-CM | POA: Diagnosis not present

## 2018-10-21 DIAGNOSIS — N400 Enlarged prostate without lower urinary tract symptoms: Secondary | ICD-10-CM | POA: Diagnosis not present

## 2018-10-21 DIAGNOSIS — D179 Benign lipomatous neoplasm, unspecified: Secondary | ICD-10-CM | POA: Diagnosis not present

## 2018-10-21 DIAGNOSIS — M150 Primary generalized (osteo)arthritis: Secondary | ICD-10-CM | POA: Diagnosis not present

## 2018-10-25 DIAGNOSIS — D1721 Benign lipomatous neoplasm of skin and subcutaneous tissue of right arm: Secondary | ICD-10-CM | POA: Diagnosis not present

## 2018-10-25 DIAGNOSIS — L821 Other seborrheic keratosis: Secondary | ICD-10-CM | POA: Diagnosis not present

## 2018-10-25 DIAGNOSIS — D179 Benign lipomatous neoplasm, unspecified: Secondary | ICD-10-CM | POA: Diagnosis not present

## 2019-02-07 DIAGNOSIS — F10239 Alcohol dependence with withdrawal, unspecified: Secondary | ICD-10-CM | POA: Diagnosis not present

## 2019-02-07 DIAGNOSIS — D225 Melanocytic nevi of trunk: Secondary | ICD-10-CM | POA: Diagnosis not present

## 2019-02-07 DIAGNOSIS — E871 Hypo-osmolality and hyponatremia: Secondary | ICD-10-CM | POA: Diagnosis not present

## 2019-02-07 DIAGNOSIS — I602 Nontraumatic subarachnoid hemorrhage from anterior communicating artery: Secondary | ICD-10-CM | POA: Diagnosis not present

## 2019-02-07 DIAGNOSIS — Z85828 Personal history of other malignant neoplasm of skin: Secondary | ICD-10-CM | POA: Diagnosis not present

## 2019-02-07 DIAGNOSIS — N390 Urinary tract infection, site not specified: Secondary | ICD-10-CM | POA: Diagnosis not present

## 2019-02-07 DIAGNOSIS — D1801 Hemangioma of skin and subcutaneous tissue: Secondary | ICD-10-CM | POA: Diagnosis not present

## 2019-02-07 DIAGNOSIS — G8191 Hemiplegia, unspecified affecting right dominant side: Secondary | ICD-10-CM | POA: Diagnosis not present

## 2019-02-07 DIAGNOSIS — R29701 NIHSS score 1: Secondary | ICD-10-CM | POA: Diagnosis not present

## 2019-02-07 DIAGNOSIS — F17213 Nicotine dependence, cigarettes, with withdrawal: Secondary | ICD-10-CM | POA: Diagnosis not present

## 2019-02-07 DIAGNOSIS — Z20828 Contact with and (suspected) exposure to other viral communicable diseases: Secondary | ICD-10-CM | POA: Diagnosis not present

## 2019-02-07 DIAGNOSIS — G9349 Other encephalopathy: Secondary | ICD-10-CM | POA: Diagnosis not present

## 2019-02-07 DIAGNOSIS — L570 Actinic keratosis: Secondary | ICD-10-CM | POA: Diagnosis not present

## 2019-02-07 DIAGNOSIS — I607 Nontraumatic subarachnoid hemorrhage from unspecified intracranial artery: Secondary | ICD-10-CM | POA: Diagnosis not present

## 2019-02-07 DIAGNOSIS — Z23 Encounter for immunization: Secondary | ICD-10-CM | POA: Diagnosis not present

## 2019-02-07 DIAGNOSIS — E876 Hypokalemia: Secondary | ICD-10-CM | POA: Diagnosis not present

## 2019-02-07 DIAGNOSIS — L821 Other seborrheic keratosis: Secondary | ICD-10-CM | POA: Diagnosis not present

## 2019-03-09 ENCOUNTER — Other Ambulatory Visit: Payer: Self-pay | Admitting: *Deleted

## 2019-03-09 NOTE — Patient Outreach (Signed)
[facility]) Care Management  03/09/2019  [name] 10/23/1941 [id number]   Subjective: Telephone call to patient's home number, no answer, left HIPAA compliant voicemail message, and requested call back.    Objective: Per KPN (Knowledge Performance Now, point of care tool) and chart review, patient has had no recent hospitalizations or ED visits.  Patient has a history of hypertension, diverticulitis, and right bundle branch block, nonobstructive coronary disease. and left anterior hemiblock.         Assessment: Received [employer] Nurse Call Line follow up referral on 03/09/2019.   Referral reason: Patient called nurse line stating woke up coughing, taste of vomit in mouth, having heart burn, shortness of breath, heaviness in chest, and throat.  Patient advised by nurse line to call EMS.   Nurse Call Line follow up pending patient contact.      Plan: RNCM will send unsuccessful outreach  letter, [facility] pamphlet, will call patient for 2nd telephone outreach attempt within 4 business days, nurse line screening follow up, and will proceed with case closure within 10 business days if no return call.     [name] H. Annia Friendly, BSN, [place] Management [facility] Telephonic CM Phone: [phone] Fax: [phone]

## 2019-03-10 ENCOUNTER — Other Ambulatory Visit: Payer: Self-pay | Admitting: *Deleted

## 2019-03-10 NOTE — Patient Outreach (Signed)
[facility]) Care Management  03/10/2019  [name] [birth year] [id number]   Subjective: Telephone call to patient's home number, no answer, left HIPAA compliant voicemail message, and requested call back.    Objective: Per KPN (Knowledge Performance Now, point of care tool) and chart review, patient has had no recent hospitalizations or ED visits.  Patient has a history of hypertension, diverticulitis, and right bundle branch block, nonobstructive coronary disease. and left anterior hemiblock.         Assessment: Received [employer] Nurse Call Line follow up referral on 03/09/2019.   Referral reason: Patient called nurse line stating woke up coughing, taste of vomit in mouth, having heart burn, shortness of breath, heaviness in chest, and throat.  Patient advised by nurse line to call EMS.   Nurse Call Line follow up pending patient contact.      Plan: RNCM will send unsuccessful outreach  letter, [facility] pamphlet, will call patient for 3rd telephone outreach attempt within 4 business days, nurse line screening follow up, and will proceed with case closure within 10 business days if no return call.     [name] H. Annia Friendly, BSN, [place] Management [facility] Telephonic CM Phone: [phone] Fax: [phone]

## 2019-03-13 ENCOUNTER — Other Ambulatory Visit: Payer: Self-pay | Admitting: *Deleted

## 2019-03-13 NOTE — Patient Outreach (Signed)
[facility]) Care Management  03/13/2019  [name] [birth year] [id number]   Subjective: Telephone call to patient's mobile number, spoke with patient, and HIPAA verified.  Discussed [facility] Care Management [employer] Nurse Call Line follow up, patient voiced understanding, and is in agreement to follow up.  Patient states he is feeling great, has not experienced any additional symptoms since 03/09/2019, did call EMS per nurse advice line recommendation, EMS came to his house on 03/09/2019, did an assessment, states they took good care of him, did an EKG, felt he had bad ingestion, no heart attack, they did not feel like he needed to be sent to ED, and he did not want to go to the ED.  Discussed importance of follow up with primary MD, aware of signs/ symptoms to report, patient voices understanding, and states he will follow up as appropriate. States he is planning to follow up with his primary MD to update on EMS per this RNCM's recommendation.  Patient states he does not have any education material, nurse line follow up,  care coordination, disease management, disease monitoring, transportation, community resource, or pharmacy needs at this time.  States he is very appreciative of the follow up.     Objective:Per KPN (Knowledge Performance Now, point of care tool) and chart review,patient has had no recent hospitalizations or ED visits. Patient has a history of hypertension, diverticulitis, and right bundle branch block, nonobstructive coronary disease. andleft anterior hemiblock.      Assessment: Received [employer] Nurse Call Line follow up referral on 03/09/2019. Referral reason: Patient called nurse line stating woke up coughing, taste of vomit in mouth, having heart burn, shortness of breath, heaviness in chest, and throat. Patient advised by nurse line to call EMS. Nurse line follow up completed and no further care management needs.        Plan:RNCM will complete case closure due to follow up completed / no care management needs.       Rylie Limburg H. Annia Friendly, BSN, [place] Management [facility] Telephonic CM Phone: [phone] Fax: [phone]

## 2019-03-22 ENCOUNTER — Other Ambulatory Visit: Payer: Self-pay | Admitting: *Deleted

## 2019-03-22 NOTE — Patient Outreach (Signed)
[facility]) Care Management  03/22/2019  ROHN FRITSCH 1941-06-13 [id number]   Per patient's 03/15/2019 designated party release, Merceda Elks (patient's wife).   Received voicemail message from patient's wife Kain Milosevic states she is returning call on behalf of her husband Primo Innis), returning RNCM's call, stated patient's name, date of birth, patient has already spoken with this RNCM as a follow up to EMS call / nurse call line, patient doing well,  is very appreciative of the follow up call last week and no return call needed.    Case will remain closed no care management needs at this time.     Davaun Quintela H. Annia Friendly, BSN, [place] Management [facility] Telephonic CM Phone: [phone] Fax: [phone]

## 2019-08-09 DIAGNOSIS — L720 Epidermal cyst: Secondary | ICD-10-CM | POA: Diagnosis not present

## 2019-08-09 DIAGNOSIS — L570 Actinic keratosis: Secondary | ICD-10-CM | POA: Diagnosis not present

## 2019-08-09 DIAGNOSIS — Z85828 Personal history of other malignant neoplasm of skin: Secondary | ICD-10-CM | POA: Diagnosis not present

## 2019-08-09 DIAGNOSIS — L821 Other seborrheic keratosis: Secondary | ICD-10-CM | POA: Diagnosis not present

## 2019-09-14 DIAGNOSIS — H25041 Posterior subcapsular polar age-related cataract, right eye: Secondary | ICD-10-CM | POA: Diagnosis not present

## 2019-09-14 DIAGNOSIS — H2511 Age-related nuclear cataract, right eye: Secondary | ICD-10-CM | POA: Diagnosis not present

## 2019-09-14 DIAGNOSIS — H25811 Combined forms of age-related cataract, right eye: Secondary | ICD-10-CM | POA: Diagnosis not present

## 2019-10-02 DIAGNOSIS — H2512 Age-related nuclear cataract, left eye: Secondary | ICD-10-CM | POA: Diagnosis not present

## 2019-11-03 DIAGNOSIS — H35373 Puckering of macula, bilateral: Secondary | ICD-10-CM | POA: Diagnosis not present

## 2019-11-03 DIAGNOSIS — H35352 Cystoid macular degeneration, left eye: Secondary | ICD-10-CM | POA: Diagnosis not present

## 2019-12-01 DIAGNOSIS — Z0000 Encounter for general adult medical examination without abnormal findings: Secondary | ICD-10-CM | POA: Diagnosis not present

## 2019-12-01 DIAGNOSIS — Z7189 Other specified counseling: Secondary | ICD-10-CM | POA: Diagnosis not present

## 2019-12-01 DIAGNOSIS — N400 Enlarged prostate without lower urinary tract symptoms: Secondary | ICD-10-CM | POA: Diagnosis not present

## 2019-12-01 DIAGNOSIS — E782 Mixed hyperlipidemia: Secondary | ICD-10-CM | POA: Diagnosis not present

## 2019-12-01 DIAGNOSIS — M150 Primary generalized (osteo)arthritis: Secondary | ICD-10-CM | POA: Diagnosis not present

## 2019-12-01 DIAGNOSIS — N529 Male erectile dysfunction, unspecified: Secondary | ICD-10-CM | POA: Diagnosis not present

## 2019-12-01 DIAGNOSIS — I10 Essential (primary) hypertension: Secondary | ICD-10-CM | POA: Diagnosis not present

## 2019-12-05 DIAGNOSIS — H35352 Cystoid macular degeneration, left eye: Secondary | ICD-10-CM | POA: Diagnosis not present

## 2019-12-11 DIAGNOSIS — I10 Essential (primary) hypertension: Secondary | ICD-10-CM | POA: Diagnosis not present

## 2019-12-11 DIAGNOSIS — E782 Mixed hyperlipidemia: Secondary | ICD-10-CM | POA: Diagnosis not present

## 2020-02-02 DIAGNOSIS — H35352 Cystoid macular degeneration, left eye: Secondary | ICD-10-CM | POA: Diagnosis not present

## 2020-02-02 DIAGNOSIS — H35371 Puckering of macula, right eye: Secondary | ICD-10-CM | POA: Diagnosis not present

## 2020-04-03 ENCOUNTER — Other Ambulatory Visit: Payer: Self-pay | Admitting: Family Medicine

## 2020-04-03 ENCOUNTER — Ambulatory Visit
Admission: RE | Admit: 2020-04-03 | Discharge: 2020-04-03 | Disposition: A | Discharge status: Home / Self Care | Payer: PPO | Source: Ambulatory Visit | Attending: Family Medicine | Admitting: Family Medicine

## 2020-04-03 DIAGNOSIS — M545 Low back pain, unspecified: Secondary | ICD-10-CM

## 2020-11-04 DIAGNOSIS — H43813 Vitreous degeneration, bilateral: Secondary | ICD-10-CM | POA: Diagnosis not present

## 2020-11-04 DIAGNOSIS — H35373 Puckering of macula, bilateral: Secondary | ICD-10-CM | POA: Diagnosis not present

## 2020-11-04 DIAGNOSIS — H524 Presbyopia: Secondary | ICD-10-CM | POA: Diagnosis not present

## 2020-11-04 DIAGNOSIS — D3132 Benign neoplasm of left choroid: Secondary | ICD-10-CM | POA: Diagnosis not present

## 2020-12-09 DIAGNOSIS — E782 Mixed hyperlipidemia: Secondary | ICD-10-CM | POA: Diagnosis not present

## 2020-12-09 DIAGNOSIS — Z0000 Encounter for general adult medical examination without abnormal findings: Secondary | ICD-10-CM | POA: Diagnosis not present

## 2020-12-09 DIAGNOSIS — M1990 Unspecified osteoarthritis, unspecified site: Secondary | ICD-10-CM | POA: Diagnosis not present

## 2020-12-09 DIAGNOSIS — N529 Male erectile dysfunction, unspecified: Secondary | ICD-10-CM | POA: Diagnosis not present

## 2020-12-09 DIAGNOSIS — I10 Essential (primary) hypertension: Secondary | ICD-10-CM | POA: Diagnosis not present

## 2020-12-09 DIAGNOSIS — N400 Enlarged prostate without lower urinary tract symptoms: Secondary | ICD-10-CM | POA: Diagnosis not present

## 2020-12-09 DIAGNOSIS — M150 Primary generalized (osteo)arthritis: Secondary | ICD-10-CM | POA: Diagnosis not present

## 2020-12-19 DIAGNOSIS — M1990 Unspecified osteoarthritis, unspecified site: Secondary | ICD-10-CM | POA: Diagnosis not present

## 2020-12-19 DIAGNOSIS — M545 Low back pain, unspecified: Secondary | ICD-10-CM | POA: Diagnosis not present

## 2020-12-23 DIAGNOSIS — M545 Low back pain, unspecified: Secondary | ICD-10-CM | POA: Diagnosis not present

## 2020-12-23 DIAGNOSIS — M1990 Unspecified osteoarthritis, unspecified site: Secondary | ICD-10-CM | POA: Diagnosis not present

## 2020-12-27 DIAGNOSIS — M545 Low back pain, unspecified: Secondary | ICD-10-CM | POA: Diagnosis not present

## 2020-12-27 DIAGNOSIS — M1990 Unspecified osteoarthritis, unspecified site: Secondary | ICD-10-CM | POA: Diagnosis not present

## 2020-12-30 DIAGNOSIS — M545 Low back pain, unspecified: Secondary | ICD-10-CM | POA: Diagnosis not present

## 2020-12-30 DIAGNOSIS — M1990 Unspecified osteoarthritis, unspecified site: Secondary | ICD-10-CM | POA: Diagnosis not present

## 2021-01-02 DIAGNOSIS — M545 Low back pain, unspecified: Secondary | ICD-10-CM | POA: Diagnosis not present

## 2021-01-02 DIAGNOSIS — M1990 Unspecified osteoarthritis, unspecified site: Secondary | ICD-10-CM | POA: Diagnosis not present

## 2021-01-06 DIAGNOSIS — Z85828 Personal history of other malignant neoplasm of skin: Secondary | ICD-10-CM | POA: Diagnosis not present

## 2021-01-06 DIAGNOSIS — L821 Other seborrheic keratosis: Secondary | ICD-10-CM | POA: Diagnosis not present

## 2021-01-06 DIAGNOSIS — D1801 Hemangioma of skin and subcutaneous tissue: Secondary | ICD-10-CM | POA: Diagnosis not present

## 2021-01-06 DIAGNOSIS — D2271 Melanocytic nevi of right lower limb, including hip: Secondary | ICD-10-CM | POA: Diagnosis not present

## 2021-01-07 DIAGNOSIS — M545 Low back pain, unspecified: Secondary | ICD-10-CM | POA: Diagnosis not present

## 2021-01-07 DIAGNOSIS — M1990 Unspecified osteoarthritis, unspecified site: Secondary | ICD-10-CM | POA: Diagnosis not present

## 2021-01-14 DIAGNOSIS — M1990 Unspecified osteoarthritis, unspecified site: Secondary | ICD-10-CM | POA: Diagnosis not present

## 2021-01-14 DIAGNOSIS — M545 Low back pain, unspecified: Secondary | ICD-10-CM | POA: Diagnosis not present

## 2021-01-21 DIAGNOSIS — M545 Low back pain, unspecified: Secondary | ICD-10-CM | POA: Diagnosis not present

## 2021-01-21 DIAGNOSIS — M1990 Unspecified osteoarthritis, unspecified site: Secondary | ICD-10-CM | POA: Diagnosis not present

## 2021-01-28 DIAGNOSIS — M545 Low back pain, unspecified: Secondary | ICD-10-CM | POA: Diagnosis not present

## 2021-01-28 DIAGNOSIS — M1990 Unspecified osteoarthritis, unspecified site: Secondary | ICD-10-CM | POA: Diagnosis not present

## 2021-02-04 DIAGNOSIS — M545 Low back pain, unspecified: Secondary | ICD-10-CM | POA: Diagnosis not present

## 2021-02-04 DIAGNOSIS — M1990 Unspecified osteoarthritis, unspecified site: Secondary | ICD-10-CM | POA: Diagnosis not present

## 2021-02-18 DIAGNOSIS — M545 Low back pain, unspecified: Secondary | ICD-10-CM | POA: Diagnosis not present

## 2021-02-18 DIAGNOSIS — M1990 Unspecified osteoarthritis, unspecified site: Secondary | ICD-10-CM | POA: Diagnosis not present

## 2021-03-04 DIAGNOSIS — M545 Low back pain, unspecified: Secondary | ICD-10-CM | POA: Diagnosis not present

## 2021-03-04 DIAGNOSIS — M1990 Unspecified osteoarthritis, unspecified site: Secondary | ICD-10-CM | POA: Diagnosis not present

## 2021-03-12 DIAGNOSIS — T1512XA Foreign body in conjunctival sac, left eye, initial encounter: Secondary | ICD-10-CM | POA: Diagnosis not present

## 2021-03-25 DIAGNOSIS — M1990 Unspecified osteoarthritis, unspecified site: Secondary | ICD-10-CM | POA: Diagnosis not present

## 2021-03-25 DIAGNOSIS — M545 Low back pain, unspecified: Secondary | ICD-10-CM | POA: Diagnosis not present

## 2021-04-15 DIAGNOSIS — M545 Low back pain, unspecified: Secondary | ICD-10-CM | POA: Diagnosis not present

## 2021-04-15 DIAGNOSIS — M1990 Unspecified osteoarthritis, unspecified site: Secondary | ICD-10-CM | POA: Diagnosis not present

## 2021-05-14 DIAGNOSIS — M1990 Unspecified osteoarthritis, unspecified site: Secondary | ICD-10-CM | POA: Diagnosis not present

## 2021-05-14 DIAGNOSIS — M545 Low back pain, unspecified: Secondary | ICD-10-CM | POA: Diagnosis not present

## 2021-06-06 DIAGNOSIS — M545 Low back pain, unspecified: Secondary | ICD-10-CM | POA: Diagnosis not present

## 2021-06-06 DIAGNOSIS — M1990 Unspecified osteoarthritis, unspecified site: Secondary | ICD-10-CM | POA: Diagnosis not present

## 2021-06-27 DIAGNOSIS — M545 Low back pain, unspecified: Secondary | ICD-10-CM | POA: Diagnosis not present

## 2021-06-27 DIAGNOSIS — M1990 Unspecified osteoarthritis, unspecified site: Secondary | ICD-10-CM | POA: Diagnosis not present

## 2021-07-22 DIAGNOSIS — M545 Low back pain, unspecified: Secondary | ICD-10-CM | POA: Diagnosis not present

## 2021-07-22 DIAGNOSIS — M1990 Unspecified osteoarthritis, unspecified site: Secondary | ICD-10-CM | POA: Diagnosis not present

## 2021-08-19 DIAGNOSIS — M545 Low back pain, unspecified: Secondary | ICD-10-CM | POA: Diagnosis not present

## 2021-08-19 DIAGNOSIS — M1990 Unspecified osteoarthritis, unspecified site: Secondary | ICD-10-CM | POA: Diagnosis not present

## 2021-09-17 DIAGNOSIS — M545 Low back pain, unspecified: Secondary | ICD-10-CM | POA: Diagnosis not present

## 2021-09-17 DIAGNOSIS — M1990 Unspecified osteoarthritis, unspecified site: Secondary | ICD-10-CM | POA: Diagnosis not present

## 2021-10-17 DIAGNOSIS — M545 Low back pain, unspecified: Secondary | ICD-10-CM | POA: Diagnosis not present

## 2021-10-17 DIAGNOSIS — M1990 Unspecified osteoarthritis, unspecified site: Secondary | ICD-10-CM | POA: Diagnosis not present

## 2021-11-17 DIAGNOSIS — M1990 Unspecified osteoarthritis, unspecified site: Secondary | ICD-10-CM | POA: Diagnosis not present

## 2021-11-17 DIAGNOSIS — M545 Low back pain, unspecified: Secondary | ICD-10-CM | POA: Diagnosis not present

## 2021-12-17 DIAGNOSIS — N529 Male erectile dysfunction, unspecified: Secondary | ICD-10-CM | POA: Diagnosis not present

## 2021-12-17 DIAGNOSIS — I10 Essential (primary) hypertension: Secondary | ICD-10-CM | POA: Diagnosis not present

## 2021-12-17 DIAGNOSIS — Z23 Encounter for immunization: Secondary | ICD-10-CM | POA: Diagnosis not present

## 2021-12-17 DIAGNOSIS — M1990 Unspecified osteoarthritis, unspecified site: Secondary | ICD-10-CM | POA: Diagnosis not present

## 2021-12-17 DIAGNOSIS — Z0000 Encounter for general adult medical examination without abnormal findings: Secondary | ICD-10-CM | POA: Diagnosis not present

## 2021-12-17 DIAGNOSIS — E782 Mixed hyperlipidemia: Secondary | ICD-10-CM | POA: Diagnosis not present

## 2021-12-17 DIAGNOSIS — N400 Enlarged prostate without lower urinary tract symptoms: Secondary | ICD-10-CM | POA: Diagnosis not present

## 2021-12-23 DIAGNOSIS — M1990 Unspecified osteoarthritis, unspecified site: Secondary | ICD-10-CM | POA: Diagnosis not present

## 2021-12-23 DIAGNOSIS — M545 Low back pain, unspecified: Secondary | ICD-10-CM | POA: Diagnosis not present

## 2022-01-14 DIAGNOSIS — H26493 Other secondary cataract, bilateral: Secondary | ICD-10-CM | POA: Diagnosis not present

## 2022-01-14 DIAGNOSIS — H35373 Puckering of macula, bilateral: Secondary | ICD-10-CM | POA: Diagnosis not present

## 2022-01-14 DIAGNOSIS — H04123 Dry eye syndrome of bilateral lacrimal glands: Secondary | ICD-10-CM | POA: Diagnosis not present

## 2022-01-21 DIAGNOSIS — M1990 Unspecified osteoarthritis, unspecified site: Secondary | ICD-10-CM | POA: Diagnosis not present

## 2022-01-21 DIAGNOSIS — M545 Low back pain, unspecified: Secondary | ICD-10-CM | POA: Diagnosis not present

## 2022-02-18 DIAGNOSIS — C4362 Malignant melanoma of left upper limb, including shoulder: Secondary | ICD-10-CM | POA: Diagnosis not present

## 2022-02-18 DIAGNOSIS — D485 Neoplasm of uncertain behavior of skin: Secondary | ICD-10-CM | POA: Diagnosis not present

## 2022-02-18 DIAGNOSIS — D0362 Melanoma in situ of left upper limb, including shoulder: Secondary | ICD-10-CM | POA: Diagnosis not present

## 2022-02-18 DIAGNOSIS — D225 Melanocytic nevi of trunk: Secondary | ICD-10-CM | POA: Diagnosis not present

## 2022-02-18 DIAGNOSIS — L821 Other seborrheic keratosis: Secondary | ICD-10-CM | POA: Diagnosis not present

## 2022-02-18 DIAGNOSIS — L820 Inflamed seborrheic keratosis: Secondary | ICD-10-CM | POA: Diagnosis not present

## 2022-02-18 DIAGNOSIS — D1801 Hemangioma of skin and subcutaneous tissue: Secondary | ICD-10-CM | POA: Diagnosis not present

## 2022-02-18 DIAGNOSIS — Z85828 Personal history of other malignant neoplasm of skin: Secondary | ICD-10-CM | POA: Diagnosis not present

## 2022-02-18 DIAGNOSIS — L570 Actinic keratosis: Secondary | ICD-10-CM | POA: Diagnosis not present

## 2022-02-20 DIAGNOSIS — M1990 Unspecified osteoarthritis, unspecified site: Secondary | ICD-10-CM | POA: Diagnosis not present

## 2022-02-20 DIAGNOSIS — M545 Low back pain, unspecified: Secondary | ICD-10-CM | POA: Diagnosis not present

## 2022-03-19 DIAGNOSIS — M1990 Unspecified osteoarthritis, unspecified site: Secondary | ICD-10-CM | POA: Diagnosis not present

## 2022-03-19 DIAGNOSIS — M545 Low back pain, unspecified: Secondary | ICD-10-CM | POA: Diagnosis not present

## 2022-04-13 DIAGNOSIS — D0362 Melanoma in situ of left upper limb, including shoulder: Secondary | ICD-10-CM | POA: Diagnosis not present

## 2022-04-13 DIAGNOSIS — Z85828 Personal history of other malignant neoplasm of skin: Secondary | ICD-10-CM | POA: Diagnosis not present

## 2022-04-13 DIAGNOSIS — L988 Other specified disorders of the skin and subcutaneous tissue: Secondary | ICD-10-CM | POA: Diagnosis not present

## 2022-04-23 DIAGNOSIS — M545 Low back pain, unspecified: Secondary | ICD-10-CM | POA: Diagnosis not present

## 2022-04-23 DIAGNOSIS — M1990 Unspecified osteoarthritis, unspecified site: Secondary | ICD-10-CM | POA: Diagnosis not present

## 2022-05-19 IMAGING — CR DG LUMBAR SPINE 2-3V
3 series · 3 of 3 positions shown · non-contrast
Comparison: None.

CLINICAL DATA: Low back pain

EXAM:
LUMBAR SPINE - 2-3 VIEW

[t l-spine a.p.]
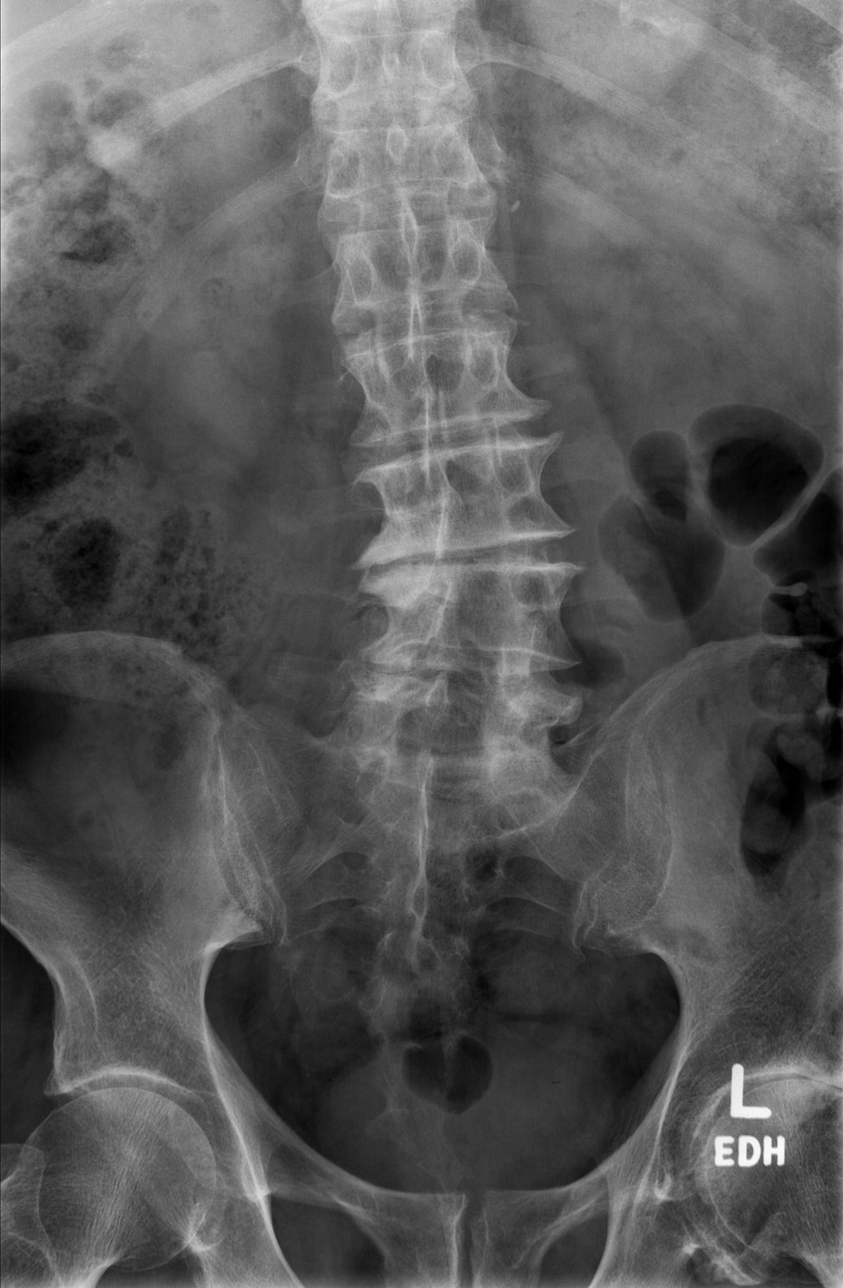

[t l-spine lat]
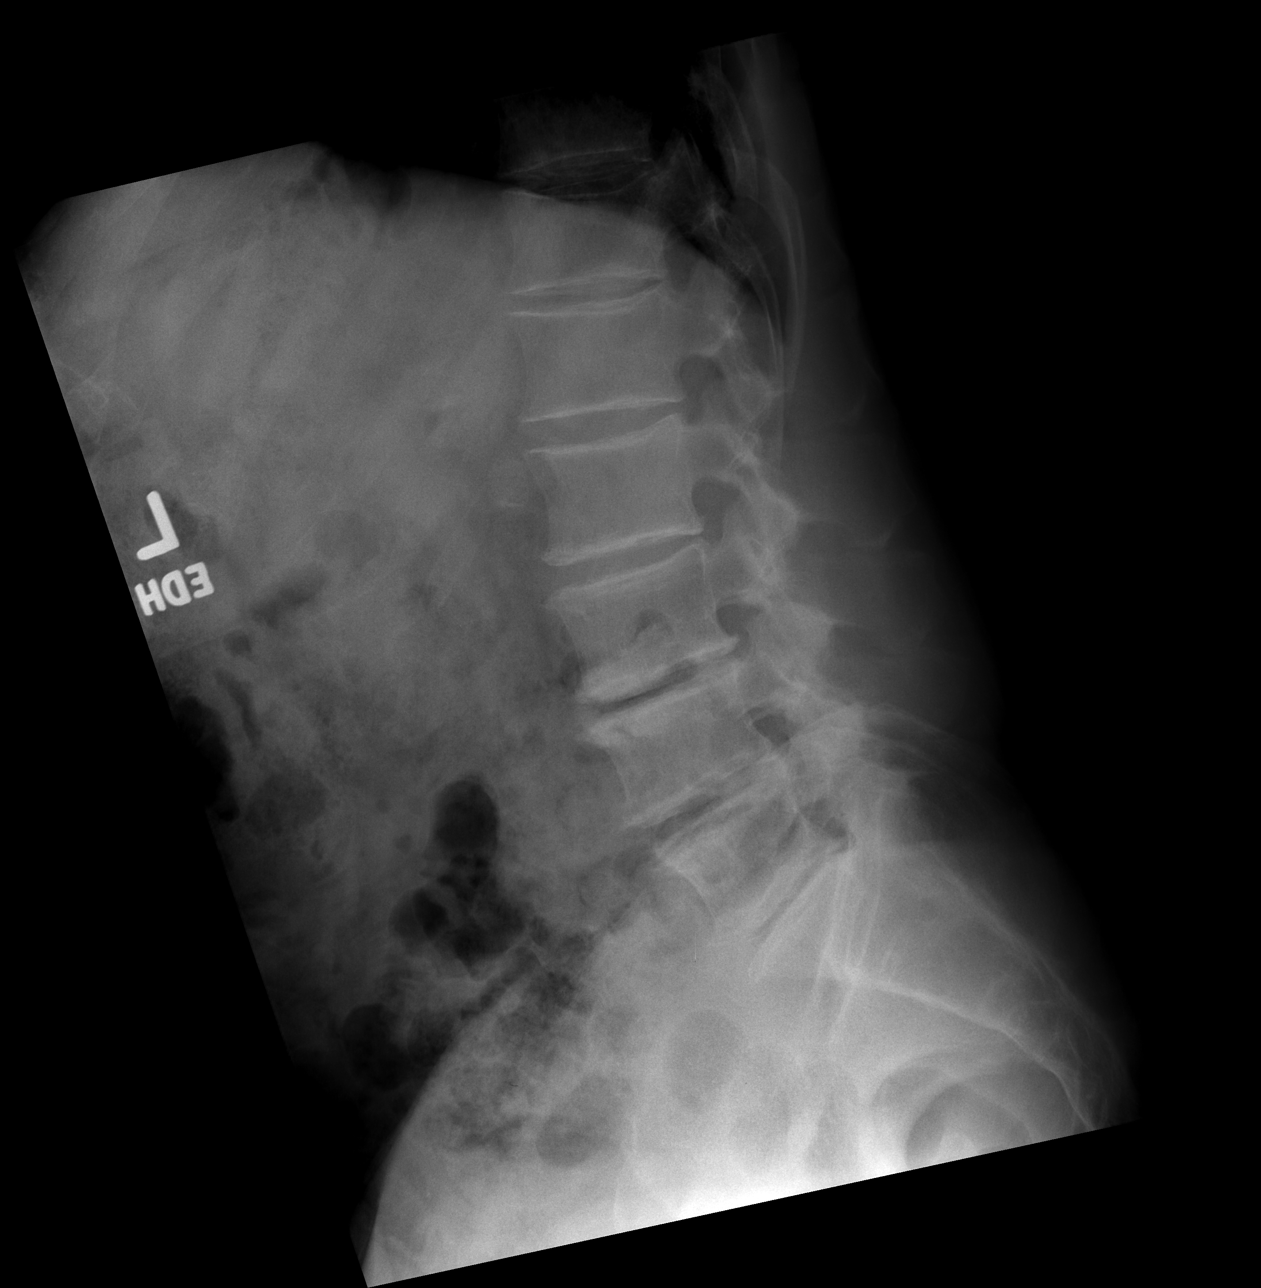

[t l-spine l5-s1 spot]
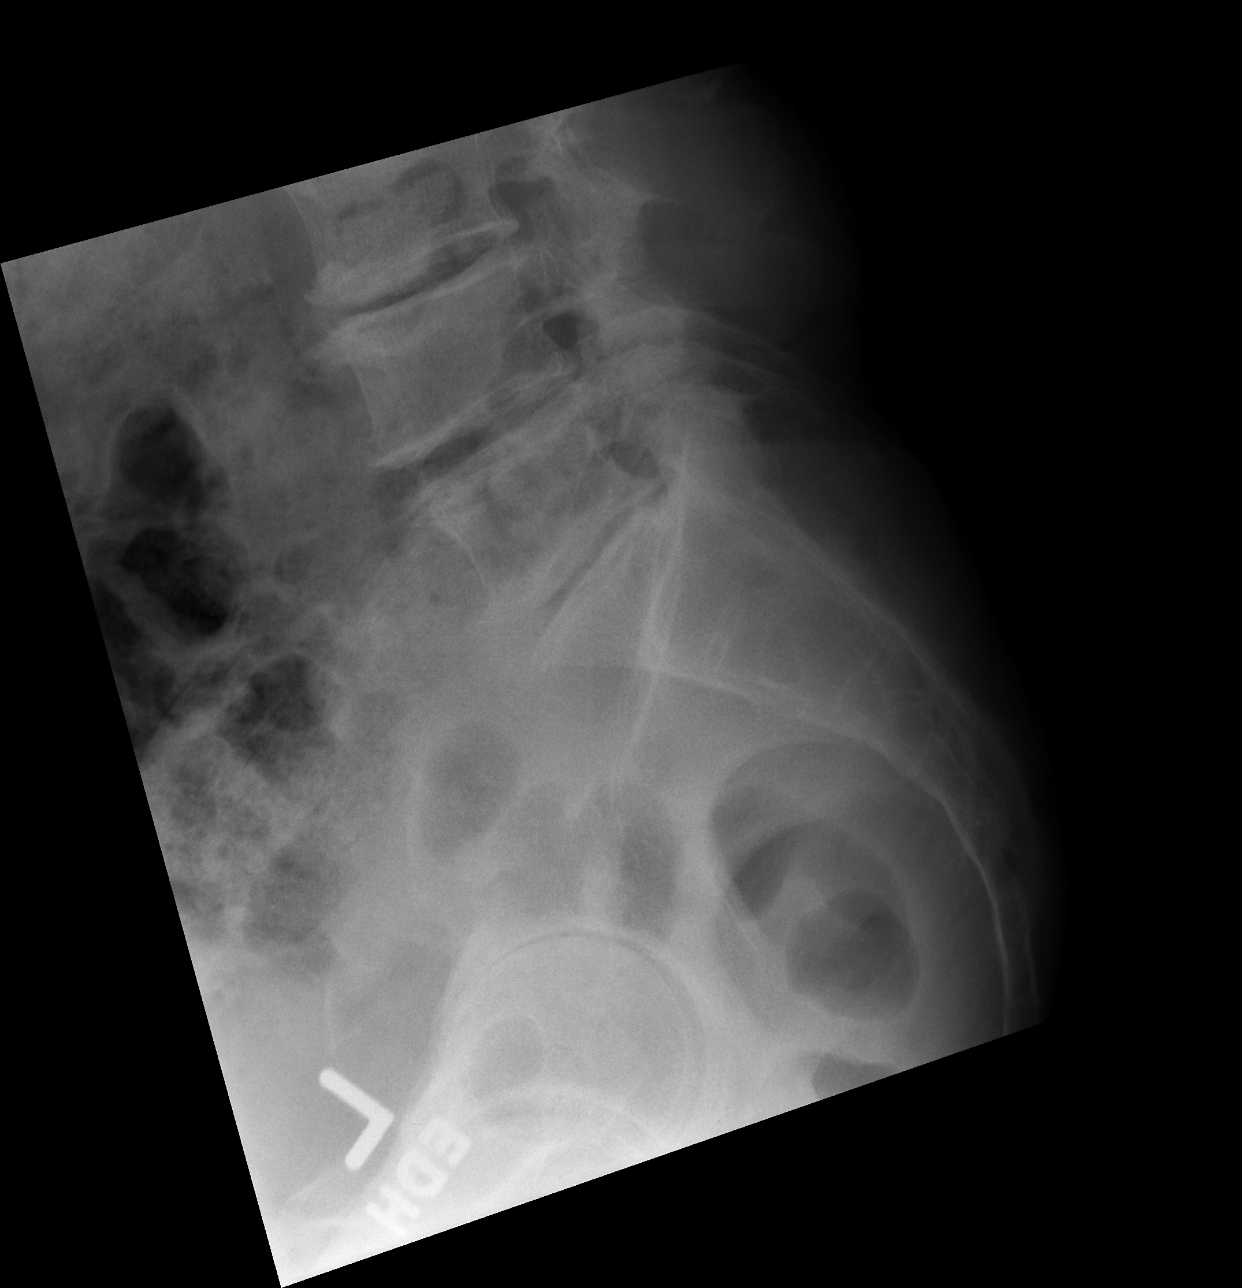

[3 of 3 positions shown; findings below may reference images not displayed]

FINDINGS: Mild levoconvex scoliotic curvature of the spine with moderate to
marked spinal degenerative changes at the L3-4 level on the RIGHT
more so than the LEFT.

Degenerative changes greatest at L3-4, L4-5 and L5-S1.

No sign of acute fracture or malalignment.
IMPRESSION: Levoconvex scoliotic curvature of the spine with moderate to marked
spinal degenerative changes at the L3-4 level on the RIGHT more so
than the LEFT.

## 2022-05-28 DIAGNOSIS — M1990 Unspecified osteoarthritis, unspecified site: Secondary | ICD-10-CM | POA: Diagnosis not present

## 2022-05-28 DIAGNOSIS — M545 Low back pain, unspecified: Secondary | ICD-10-CM | POA: Diagnosis not present

## 2022-06-02 DIAGNOSIS — C44319 Basal cell carcinoma of skin of other parts of face: Secondary | ICD-10-CM | POA: Diagnosis not present

## 2022-06-02 DIAGNOSIS — D225 Melanocytic nevi of trunk: Secondary | ICD-10-CM | POA: Diagnosis not present

## 2022-06-02 DIAGNOSIS — Z85820 Personal history of malignant melanoma of skin: Secondary | ICD-10-CM | POA: Diagnosis not present

## 2022-06-02 DIAGNOSIS — D2272 Melanocytic nevi of left lower limb, including hip: Secondary | ICD-10-CM | POA: Diagnosis not present

## 2022-06-02 DIAGNOSIS — L570 Actinic keratosis: Secondary | ICD-10-CM | POA: Diagnosis not present

## 2022-06-02 DIAGNOSIS — L821 Other seborrheic keratosis: Secondary | ICD-10-CM | POA: Diagnosis not present

## 2022-06-02 DIAGNOSIS — L820 Inflamed seborrheic keratosis: Secondary | ICD-10-CM | POA: Diagnosis not present

## 2022-06-02 DIAGNOSIS — L723 Sebaceous cyst: Secondary | ICD-10-CM | POA: Diagnosis not present

## 2022-06-02 DIAGNOSIS — C44311 Basal cell carcinoma of skin of nose: Secondary | ICD-10-CM | POA: Diagnosis not present

## 2022-06-02 DIAGNOSIS — Z85828 Personal history of other malignant neoplasm of skin: Secondary | ICD-10-CM | POA: Diagnosis not present

## 2022-07-07 DIAGNOSIS — M545 Low back pain, unspecified: Secondary | ICD-10-CM | POA: Diagnosis not present

## 2022-07-07 DIAGNOSIS — M1990 Unspecified osteoarthritis, unspecified site: Secondary | ICD-10-CM | POA: Diagnosis not present

## 2022-07-09 DIAGNOSIS — C4401 Basal cell carcinoma of skin of lip: Secondary | ICD-10-CM | POA: Diagnosis not present

## 2022-07-09 DIAGNOSIS — Z85828 Personal history of other malignant neoplasm of skin: Secondary | ICD-10-CM | POA: Diagnosis not present

## 2022-08-18 DIAGNOSIS — M545 Low back pain, unspecified: Secondary | ICD-10-CM | POA: Diagnosis not present

## 2022-08-18 DIAGNOSIS — M1990 Unspecified osteoarthritis, unspecified site: Secondary | ICD-10-CM | POA: Diagnosis not present

## 2022-09-01 DIAGNOSIS — B353 Tinea pedis: Secondary | ICD-10-CM | POA: Diagnosis not present

## 2022-09-01 DIAGNOSIS — D2271 Melanocytic nevi of right lower limb, including hip: Secondary | ICD-10-CM | POA: Diagnosis not present

## 2022-09-01 DIAGNOSIS — D225 Melanocytic nevi of trunk: Secondary | ICD-10-CM | POA: Diagnosis not present

## 2022-09-01 DIAGNOSIS — Z85828 Personal history of other malignant neoplasm of skin: Secondary | ICD-10-CM | POA: Diagnosis not present

## 2022-09-01 DIAGNOSIS — L821 Other seborrheic keratosis: Secondary | ICD-10-CM | POA: Diagnosis not present

## 2022-09-01 DIAGNOSIS — D1801 Hemangioma of skin and subcutaneous tissue: Secondary | ICD-10-CM | POA: Diagnosis not present

## 2022-09-14 DIAGNOSIS — M545 Low back pain, unspecified: Secondary | ICD-10-CM | POA: Diagnosis not present

## 2022-09-14 DIAGNOSIS — M1990 Unspecified osteoarthritis, unspecified site: Secondary | ICD-10-CM | POA: Diagnosis not present

## 2022-10-08 DIAGNOSIS — M545 Low back pain, unspecified: Secondary | ICD-10-CM | POA: Diagnosis not present

## 2022-10-08 DIAGNOSIS — M1990 Unspecified osteoarthritis, unspecified site: Secondary | ICD-10-CM | POA: Diagnosis not present

## 2022-11-04 DIAGNOSIS — M545 Low back pain, unspecified: Secondary | ICD-10-CM | POA: Diagnosis not present

## 2022-11-04 DIAGNOSIS — M1990 Unspecified osteoarthritis, unspecified site: Secondary | ICD-10-CM | POA: Diagnosis not present

## 2022-11-25 DIAGNOSIS — L820 Inflamed seborrheic keratosis: Secondary | ICD-10-CM | POA: Diagnosis not present

## 2022-11-25 DIAGNOSIS — D1801 Hemangioma of skin and subcutaneous tissue: Secondary | ICD-10-CM | POA: Diagnosis not present

## 2022-11-25 DIAGNOSIS — Z85820 Personal history of malignant melanoma of skin: Secondary | ICD-10-CM | POA: Diagnosis not present

## 2022-11-25 DIAGNOSIS — L821 Other seborrheic keratosis: Secondary | ICD-10-CM | POA: Diagnosis not present

## 2022-11-25 DIAGNOSIS — Z85828 Personal history of other malignant neoplasm of skin: Secondary | ICD-10-CM | POA: Diagnosis not present

## 2022-11-25 DIAGNOSIS — L738 Other specified follicular disorders: Secondary | ICD-10-CM | POA: Diagnosis not present

## 2022-11-25 DIAGNOSIS — D225 Melanocytic nevi of trunk: Secondary | ICD-10-CM | POA: Diagnosis not present

## 2022-12-03 DIAGNOSIS — M545 Low back pain, unspecified: Secondary | ICD-10-CM | POA: Diagnosis not present

## 2022-12-03 DIAGNOSIS — M1990 Unspecified osteoarthritis, unspecified site: Secondary | ICD-10-CM | POA: Diagnosis not present

## 2022-12-17 DIAGNOSIS — M545 Low back pain, unspecified: Secondary | ICD-10-CM | POA: Diagnosis not present

## 2022-12-17 DIAGNOSIS — M1990 Unspecified osteoarthritis, unspecified site: Secondary | ICD-10-CM | POA: Diagnosis not present

## 2022-12-23 DIAGNOSIS — I10 Essential (primary) hypertension: Secondary | ICD-10-CM | POA: Diagnosis not present

## 2022-12-23 DIAGNOSIS — Z9181 History of falling: Secondary | ICD-10-CM | POA: Diagnosis not present

## 2022-12-23 DIAGNOSIS — N400 Enlarged prostate without lower urinary tract symptoms: Secondary | ICD-10-CM | POA: Diagnosis not present

## 2022-12-23 DIAGNOSIS — Z0000 Encounter for general adult medical examination without abnormal findings: Secondary | ICD-10-CM | POA: Diagnosis not present

## 2022-12-23 DIAGNOSIS — Z23 Encounter for immunization: Secondary | ICD-10-CM | POA: Diagnosis not present

## 2022-12-23 DIAGNOSIS — N529 Male erectile dysfunction, unspecified: Secondary | ICD-10-CM | POA: Diagnosis not present

## 2022-12-23 DIAGNOSIS — E782 Mixed hyperlipidemia: Secondary | ICD-10-CM | POA: Diagnosis not present

## 2022-12-23 DIAGNOSIS — M1990 Unspecified osteoarthritis, unspecified site: Secondary | ICD-10-CM | POA: Diagnosis not present

## 2022-12-24 ENCOUNTER — Other Ambulatory Visit: Payer: Self-pay | Admitting: Family Medicine

## 2022-12-24 DIAGNOSIS — M1990 Unspecified osteoarthritis, unspecified site: Secondary | ICD-10-CM

## 2023-01-05 DIAGNOSIS — M545 Low back pain, unspecified: Secondary | ICD-10-CM | POA: Diagnosis not present

## 2023-01-05 DIAGNOSIS — M1990 Unspecified osteoarthritis, unspecified site: Secondary | ICD-10-CM | POA: Diagnosis not present

## 2023-01-16 ENCOUNTER — Ambulatory Visit
Admission: RE | Admit: 2023-01-16 | Discharge: 2023-01-16 | Disposition: A | Discharge status: Home / Self Care | Payer: PPO | Source: Ambulatory Visit | Attending: Family Medicine | Admitting: Family Medicine

## 2023-01-16 DIAGNOSIS — M1990 Unspecified osteoarthritis, unspecified site: Secondary | ICD-10-CM

## 2023-01-16 DIAGNOSIS — M5116 Intervertebral disc disorders with radiculopathy, lumbar region: Secondary | ICD-10-CM | POA: Diagnosis not present

## 2023-01-16 DIAGNOSIS — M48061 Spinal stenosis, lumbar region without neurogenic claudication: Secondary | ICD-10-CM | POA: Diagnosis not present

## 2023-01-16 MED ORDER — GADOPICLENOL 0.5 MMOL/ML IV SOLN
9.0000 mL | Freq: Once | INTRAVENOUS | Status: AC | PRN
  Administered 2023-01-16: 9 mL via INTRAVENOUS

## 2023-01-20 DIAGNOSIS — M47816 Spondylosis without myelopathy or radiculopathy, lumbar region: Secondary | ICD-10-CM | POA: Diagnosis not present

## 2023-01-20 DIAGNOSIS — M5416 Radiculopathy, lumbar region: Secondary | ICD-10-CM | POA: Diagnosis not present

## 2023-01-26 DIAGNOSIS — H43813 Vitreous degeneration, bilateral: Secondary | ICD-10-CM | POA: Diagnosis not present

## 2023-01-26 DIAGNOSIS — H35373 Puckering of macula, bilateral: Secondary | ICD-10-CM | POA: Diagnosis not present

## 2023-01-26 DIAGNOSIS — H26493 Other secondary cataract, bilateral: Secondary | ICD-10-CM | POA: Diagnosis not present

## 2023-01-26 DIAGNOSIS — D3132 Benign neoplasm of left choroid: Secondary | ICD-10-CM | POA: Diagnosis not present

## 2023-02-02 DIAGNOSIS — M47816 Spondylosis without myelopathy or radiculopathy, lumbar region: Secondary | ICD-10-CM | POA: Diagnosis not present

## 2023-02-10 DIAGNOSIS — M9903 Segmental and somatic dysfunction of lumbar region: Secondary | ICD-10-CM | POA: Diagnosis not present

## 2023-02-10 DIAGNOSIS — M9904 Segmental and somatic dysfunction of sacral region: Secondary | ICD-10-CM | POA: Diagnosis not present

## 2023-02-10 DIAGNOSIS — M5136 Other intervertebral disc degeneration, lumbar region: Secondary | ICD-10-CM | POA: Diagnosis not present

## 2023-02-10 DIAGNOSIS — M9905 Segmental and somatic dysfunction of pelvic region: Secondary | ICD-10-CM | POA: Diagnosis not present

## 2023-02-11 DIAGNOSIS — M9903 Segmental and somatic dysfunction of lumbar region: Secondary | ICD-10-CM | POA: Diagnosis not present

## 2023-02-11 DIAGNOSIS — M9905 Segmental and somatic dysfunction of pelvic region: Secondary | ICD-10-CM | POA: Diagnosis not present

## 2023-02-11 DIAGNOSIS — M5136 Other intervertebral disc degeneration, lumbar region: Secondary | ICD-10-CM | POA: Diagnosis not present

## 2023-02-11 DIAGNOSIS — M9904 Segmental and somatic dysfunction of sacral region: Secondary | ICD-10-CM | POA: Diagnosis not present

## 2023-02-17 DIAGNOSIS — M5136 Other intervertebral disc degeneration, lumbar region: Secondary | ICD-10-CM | POA: Diagnosis not present

## 2023-02-17 DIAGNOSIS — M9905 Segmental and somatic dysfunction of pelvic region: Secondary | ICD-10-CM | POA: Diagnosis not present

## 2023-02-17 DIAGNOSIS — M9903 Segmental and somatic dysfunction of lumbar region: Secondary | ICD-10-CM | POA: Diagnosis not present

## 2023-02-17 DIAGNOSIS — M9904 Segmental and somatic dysfunction of sacral region: Secondary | ICD-10-CM | POA: Diagnosis not present

## 2023-02-23 DIAGNOSIS — M48062 Spinal stenosis, lumbar region with neurogenic claudication: Secondary | ICD-10-CM | POA: Diagnosis not present

## 2023-02-23 DIAGNOSIS — Z6827 Body mass index (BMI) 27.0-27.9, adult: Secondary | ICD-10-CM | POA: Diagnosis not present

## 2023-02-24 DIAGNOSIS — M5136 Other intervertebral disc degeneration, lumbar region: Secondary | ICD-10-CM | POA: Diagnosis not present

## 2023-02-24 DIAGNOSIS — M9905 Segmental and somatic dysfunction of pelvic region: Secondary | ICD-10-CM | POA: Diagnosis not present

## 2023-02-24 DIAGNOSIS — M9904 Segmental and somatic dysfunction of sacral region: Secondary | ICD-10-CM | POA: Diagnosis not present

## 2023-02-24 DIAGNOSIS — M9903 Segmental and somatic dysfunction of lumbar region: Secondary | ICD-10-CM | POA: Diagnosis not present

## 2023-03-02 DIAGNOSIS — M5136 Other intervertebral disc degeneration, lumbar region: Secondary | ICD-10-CM | POA: Diagnosis not present

## 2023-03-02 DIAGNOSIS — M9903 Segmental and somatic dysfunction of lumbar region: Secondary | ICD-10-CM | POA: Diagnosis not present

## 2023-03-02 DIAGNOSIS — M9904 Segmental and somatic dysfunction of sacral region: Secondary | ICD-10-CM | POA: Diagnosis not present

## 2023-03-02 DIAGNOSIS — M9905 Segmental and somatic dysfunction of pelvic region: Secondary | ICD-10-CM | POA: Diagnosis not present

## 2023-03-04 DIAGNOSIS — M9905 Segmental and somatic dysfunction of pelvic region: Secondary | ICD-10-CM | POA: Diagnosis not present

## 2023-03-04 DIAGNOSIS — M9904 Segmental and somatic dysfunction of sacral region: Secondary | ICD-10-CM | POA: Diagnosis not present

## 2023-03-04 DIAGNOSIS — M9903 Segmental and somatic dysfunction of lumbar region: Secondary | ICD-10-CM | POA: Diagnosis not present

## 2023-03-04 DIAGNOSIS — M5136 Other intervertebral disc degeneration, lumbar region: Secondary | ICD-10-CM | POA: Diagnosis not present

## 2023-03-11 DIAGNOSIS — M9903 Segmental and somatic dysfunction of lumbar region: Secondary | ICD-10-CM | POA: Diagnosis not present

## 2023-03-11 DIAGNOSIS — M9905 Segmental and somatic dysfunction of pelvic region: Secondary | ICD-10-CM | POA: Diagnosis not present

## 2023-03-11 DIAGNOSIS — M5136 Other intervertebral disc degeneration, lumbar region: Secondary | ICD-10-CM | POA: Diagnosis not present

## 2023-03-11 DIAGNOSIS — M9904 Segmental and somatic dysfunction of sacral region: Secondary | ICD-10-CM | POA: Diagnosis not present

## 2023-03-15 DIAGNOSIS — M48062 Spinal stenosis, lumbar region with neurogenic claudication: Secondary | ICD-10-CM | POA: Diagnosis not present

## 2023-03-16 DIAGNOSIS — M9904 Segmental and somatic dysfunction of sacral region: Secondary | ICD-10-CM | POA: Diagnosis not present

## 2023-03-16 DIAGNOSIS — M5136 Other intervertebral disc degeneration, lumbar region: Secondary | ICD-10-CM | POA: Diagnosis not present

## 2023-03-16 DIAGNOSIS — M9903 Segmental and somatic dysfunction of lumbar region: Secondary | ICD-10-CM | POA: Diagnosis not present

## 2023-03-16 DIAGNOSIS — M9905 Segmental and somatic dysfunction of pelvic region: Secondary | ICD-10-CM | POA: Diagnosis not present

## 2023-03-18 DIAGNOSIS — M5136 Other intervertebral disc degeneration, lumbar region: Secondary | ICD-10-CM | POA: Diagnosis not present

## 2023-03-18 DIAGNOSIS — M9905 Segmental and somatic dysfunction of pelvic region: Secondary | ICD-10-CM | POA: Diagnosis not present

## 2023-03-18 DIAGNOSIS — M9904 Segmental and somatic dysfunction of sacral region: Secondary | ICD-10-CM | POA: Diagnosis not present

## 2023-03-18 DIAGNOSIS — M9903 Segmental and somatic dysfunction of lumbar region: Secondary | ICD-10-CM | POA: Diagnosis not present

## 2023-03-23 DIAGNOSIS — M5136 Other intervertebral disc degeneration, lumbar region: Secondary | ICD-10-CM | POA: Diagnosis not present

## 2023-03-23 DIAGNOSIS — M9903 Segmental and somatic dysfunction of lumbar region: Secondary | ICD-10-CM | POA: Diagnosis not present

## 2023-03-23 DIAGNOSIS — M9905 Segmental and somatic dysfunction of pelvic region: Secondary | ICD-10-CM | POA: Diagnosis not present

## 2023-03-23 DIAGNOSIS — M9904 Segmental and somatic dysfunction of sacral region: Secondary | ICD-10-CM | POA: Diagnosis not present

## 2023-03-25 DIAGNOSIS — M9904 Segmental and somatic dysfunction of sacral region: Secondary | ICD-10-CM | POA: Diagnosis not present

## 2023-03-25 DIAGNOSIS — M9903 Segmental and somatic dysfunction of lumbar region: Secondary | ICD-10-CM | POA: Diagnosis not present

## 2023-03-25 DIAGNOSIS — M9905 Segmental and somatic dysfunction of pelvic region: Secondary | ICD-10-CM | POA: Diagnosis not present

## 2023-03-25 DIAGNOSIS — M5136 Other intervertebral disc degeneration, lumbar region: Secondary | ICD-10-CM | POA: Diagnosis not present

## 2023-03-31 DIAGNOSIS — M5136 Other intervertebral disc degeneration, lumbar region: Secondary | ICD-10-CM | POA: Diagnosis not present

## 2023-03-31 DIAGNOSIS — M9905 Segmental and somatic dysfunction of pelvic region: Secondary | ICD-10-CM | POA: Diagnosis not present

## 2023-03-31 DIAGNOSIS — M9903 Segmental and somatic dysfunction of lumbar region: Secondary | ICD-10-CM | POA: Diagnosis not present

## 2023-03-31 DIAGNOSIS — M9904 Segmental and somatic dysfunction of sacral region: Secondary | ICD-10-CM | POA: Diagnosis not present

## 2023-04-06 DIAGNOSIS — M9903 Segmental and somatic dysfunction of lumbar region: Secondary | ICD-10-CM | POA: Diagnosis not present

## 2023-04-06 DIAGNOSIS — M9904 Segmental and somatic dysfunction of sacral region: Secondary | ICD-10-CM | POA: Diagnosis not present

## 2023-04-06 DIAGNOSIS — M9905 Segmental and somatic dysfunction of pelvic region: Secondary | ICD-10-CM | POA: Diagnosis not present

## 2023-04-06 DIAGNOSIS — M5136 Other intervertebral disc degeneration, lumbar region: Secondary | ICD-10-CM | POA: Diagnosis not present

## 2023-04-12 DIAGNOSIS — M9904 Segmental and somatic dysfunction of sacral region: Secondary | ICD-10-CM | POA: Diagnosis not present

## 2023-04-12 DIAGNOSIS — M9905 Segmental and somatic dysfunction of pelvic region: Secondary | ICD-10-CM | POA: Diagnosis not present

## 2023-04-12 DIAGNOSIS — M48062 Spinal stenosis, lumbar region with neurogenic claudication: Secondary | ICD-10-CM | POA: Diagnosis not present

## 2023-04-12 DIAGNOSIS — M9903 Segmental and somatic dysfunction of lumbar region: Secondary | ICD-10-CM | POA: Diagnosis not present

## 2023-04-12 DIAGNOSIS — M5136 Other intervertebral disc degeneration, lumbar region: Secondary | ICD-10-CM | POA: Diagnosis not present

## 2023-04-12 DIAGNOSIS — Z6827 Body mass index (BMI) 27.0-27.9, adult: Secondary | ICD-10-CM | POA: Diagnosis not present

## 2023-04-15 DIAGNOSIS — M9904 Segmental and somatic dysfunction of sacral region: Secondary | ICD-10-CM | POA: Diagnosis not present

## 2023-04-15 DIAGNOSIS — M9903 Segmental and somatic dysfunction of lumbar region: Secondary | ICD-10-CM | POA: Diagnosis not present

## 2023-04-15 DIAGNOSIS — M9905 Segmental and somatic dysfunction of pelvic region: Secondary | ICD-10-CM | POA: Diagnosis not present

## 2023-04-15 DIAGNOSIS — M5136 Other intervertebral disc degeneration, lumbar region: Secondary | ICD-10-CM | POA: Diagnosis not present

## 2023-04-21 DIAGNOSIS — M9903 Segmental and somatic dysfunction of lumbar region: Secondary | ICD-10-CM | POA: Diagnosis not present

## 2023-04-21 DIAGNOSIS — M5136 Other intervertebral disc degeneration, lumbar region: Secondary | ICD-10-CM | POA: Diagnosis not present

## 2023-04-21 DIAGNOSIS — M9904 Segmental and somatic dysfunction of sacral region: Secondary | ICD-10-CM | POA: Diagnosis not present

## 2023-04-21 DIAGNOSIS — M9905 Segmental and somatic dysfunction of pelvic region: Secondary | ICD-10-CM | POA: Diagnosis not present

## 2023-04-26 DIAGNOSIS — M5136 Other intervertebral disc degeneration, lumbar region: Secondary | ICD-10-CM | POA: Diagnosis not present

## 2023-04-26 DIAGNOSIS — M9905 Segmental and somatic dysfunction of pelvic region: Secondary | ICD-10-CM | POA: Diagnosis not present

## 2023-04-26 DIAGNOSIS — M9904 Segmental and somatic dysfunction of sacral region: Secondary | ICD-10-CM | POA: Diagnosis not present

## 2023-04-26 DIAGNOSIS — M9903 Segmental and somatic dysfunction of lumbar region: Secondary | ICD-10-CM | POA: Diagnosis not present

## 2023-04-28 DIAGNOSIS — M5136 Other intervertebral disc degeneration, lumbar region: Secondary | ICD-10-CM | POA: Diagnosis not present

## 2023-04-28 DIAGNOSIS — M9904 Segmental and somatic dysfunction of sacral region: Secondary | ICD-10-CM | POA: Diagnosis not present

## 2023-04-28 DIAGNOSIS — M9905 Segmental and somatic dysfunction of pelvic region: Secondary | ICD-10-CM | POA: Diagnosis not present

## 2023-04-28 DIAGNOSIS — M9903 Segmental and somatic dysfunction of lumbar region: Secondary | ICD-10-CM | POA: Diagnosis not present

## 2023-05-03 DIAGNOSIS — M5136 Other intervertebral disc degeneration, lumbar region: Secondary | ICD-10-CM | POA: Diagnosis not present

## 2023-05-03 DIAGNOSIS — M9905 Segmental and somatic dysfunction of pelvic region: Secondary | ICD-10-CM | POA: Diagnosis not present

## 2023-05-03 DIAGNOSIS — M9903 Segmental and somatic dysfunction of lumbar region: Secondary | ICD-10-CM | POA: Diagnosis not present

## 2023-05-03 DIAGNOSIS — M9904 Segmental and somatic dysfunction of sacral region: Secondary | ICD-10-CM | POA: Diagnosis not present

## 2023-05-06 DIAGNOSIS — M5136 Other intervertebral disc degeneration, lumbar region: Secondary | ICD-10-CM | POA: Diagnosis not present

## 2023-05-06 DIAGNOSIS — M9905 Segmental and somatic dysfunction of pelvic region: Secondary | ICD-10-CM | POA: Diagnosis not present

## 2023-05-06 DIAGNOSIS — M9904 Segmental and somatic dysfunction of sacral region: Secondary | ICD-10-CM | POA: Diagnosis not present

## 2023-05-06 DIAGNOSIS — M9903 Segmental and somatic dysfunction of lumbar region: Secondary | ICD-10-CM | POA: Diagnosis not present

## 2023-05-10 DIAGNOSIS — M9904 Segmental and somatic dysfunction of sacral region: Secondary | ICD-10-CM | POA: Diagnosis not present

## 2023-05-10 DIAGNOSIS — M9905 Segmental and somatic dysfunction of pelvic region: Secondary | ICD-10-CM | POA: Diagnosis not present

## 2023-05-10 DIAGNOSIS — M5136 Other intervertebral disc degeneration, lumbar region: Secondary | ICD-10-CM | POA: Diagnosis not present

## 2023-05-10 DIAGNOSIS — M9903 Segmental and somatic dysfunction of lumbar region: Secondary | ICD-10-CM | POA: Diagnosis not present

## 2023-05-13 DIAGNOSIS — M9903 Segmental and somatic dysfunction of lumbar region: Secondary | ICD-10-CM | POA: Diagnosis not present

## 2023-05-13 DIAGNOSIS — M5136 Other intervertebral disc degeneration, lumbar region: Secondary | ICD-10-CM | POA: Diagnosis not present

## 2023-05-13 DIAGNOSIS — M9905 Segmental and somatic dysfunction of pelvic region: Secondary | ICD-10-CM | POA: Diagnosis not present

## 2023-05-13 DIAGNOSIS — M9904 Segmental and somatic dysfunction of sacral region: Secondary | ICD-10-CM | POA: Diagnosis not present

## 2023-05-17 DIAGNOSIS — M9905 Segmental and somatic dysfunction of pelvic region: Secondary | ICD-10-CM | POA: Diagnosis not present

## 2023-05-17 DIAGNOSIS — M9904 Segmental and somatic dysfunction of sacral region: Secondary | ICD-10-CM | POA: Diagnosis not present

## 2023-05-17 DIAGNOSIS — M9903 Segmental and somatic dysfunction of lumbar region: Secondary | ICD-10-CM | POA: Diagnosis not present

## 2023-05-17 DIAGNOSIS — M5136 Other intervertebral disc degeneration, lumbar region: Secondary | ICD-10-CM | POA: Diagnosis not present

## 2023-05-19 DIAGNOSIS — D485 Neoplasm of uncertain behavior of skin: Secondary | ICD-10-CM | POA: Diagnosis not present

## 2023-05-19 DIAGNOSIS — Z85820 Personal history of malignant melanoma of skin: Secondary | ICD-10-CM | POA: Diagnosis not present

## 2023-05-19 DIAGNOSIS — L821 Other seborrheic keratosis: Secondary | ICD-10-CM | POA: Diagnosis not present

## 2023-05-19 DIAGNOSIS — L820 Inflamed seborrheic keratosis: Secondary | ICD-10-CM | POA: Diagnosis not present

## 2023-05-19 DIAGNOSIS — Z85828 Personal history of other malignant neoplasm of skin: Secondary | ICD-10-CM | POA: Diagnosis not present

## 2023-05-19 DIAGNOSIS — L720 Epidermal cyst: Secondary | ICD-10-CM | POA: Diagnosis not present

## 2023-05-20 DIAGNOSIS — M5136 Other intervertebral disc degeneration, lumbar region: Secondary | ICD-10-CM | POA: Diagnosis not present

## 2023-05-20 DIAGNOSIS — M9903 Segmental and somatic dysfunction of lumbar region: Secondary | ICD-10-CM | POA: Diagnosis not present

## 2023-05-20 DIAGNOSIS — M9905 Segmental and somatic dysfunction of pelvic region: Secondary | ICD-10-CM | POA: Diagnosis not present

## 2023-05-20 DIAGNOSIS — M9904 Segmental and somatic dysfunction of sacral region: Secondary | ICD-10-CM | POA: Diagnosis not present

## 2023-05-24 DIAGNOSIS — M9904 Segmental and somatic dysfunction of sacral region: Secondary | ICD-10-CM | POA: Diagnosis not present

## 2023-05-24 DIAGNOSIS — M9905 Segmental and somatic dysfunction of pelvic region: Secondary | ICD-10-CM | POA: Diagnosis not present

## 2023-05-24 DIAGNOSIS — M5136 Other intervertebral disc degeneration, lumbar region: Secondary | ICD-10-CM | POA: Diagnosis not present

## 2023-05-24 DIAGNOSIS — M9903 Segmental and somatic dysfunction of lumbar region: Secondary | ICD-10-CM | POA: Diagnosis not present

## 2023-05-31 DIAGNOSIS — M9904 Segmental and somatic dysfunction of sacral region: Secondary | ICD-10-CM | POA: Diagnosis not present

## 2023-05-31 DIAGNOSIS — M5136 Other intervertebral disc degeneration, lumbar region: Secondary | ICD-10-CM | POA: Diagnosis not present

## 2023-05-31 DIAGNOSIS — M9903 Segmental and somatic dysfunction of lumbar region: Secondary | ICD-10-CM | POA: Diagnosis not present

## 2023-05-31 DIAGNOSIS — M9905 Segmental and somatic dysfunction of pelvic region: Secondary | ICD-10-CM | POA: Diagnosis not present

## 2023-06-03 DIAGNOSIS — M9904 Segmental and somatic dysfunction of sacral region: Secondary | ICD-10-CM | POA: Diagnosis not present

## 2023-06-03 DIAGNOSIS — M9905 Segmental and somatic dysfunction of pelvic region: Secondary | ICD-10-CM | POA: Diagnosis not present

## 2023-06-03 DIAGNOSIS — M5136 Other intervertebral disc degeneration, lumbar region: Secondary | ICD-10-CM | POA: Diagnosis not present

## 2023-06-03 DIAGNOSIS — M9903 Segmental and somatic dysfunction of lumbar region: Secondary | ICD-10-CM | POA: Diagnosis not present

## 2023-06-08 DIAGNOSIS — M9905 Segmental and somatic dysfunction of pelvic region: Secondary | ICD-10-CM | POA: Diagnosis not present

## 2023-06-08 DIAGNOSIS — M9904 Segmental and somatic dysfunction of sacral region: Secondary | ICD-10-CM | POA: Diagnosis not present

## 2023-06-08 DIAGNOSIS — M5136 Other intervertebral disc degeneration, lumbar region: Secondary | ICD-10-CM | POA: Diagnosis not present

## 2023-06-08 DIAGNOSIS — M9903 Segmental and somatic dysfunction of lumbar region: Secondary | ICD-10-CM | POA: Diagnosis not present

## 2023-06-16 DIAGNOSIS — M48062 Spinal stenosis, lumbar region with neurogenic claudication: Secondary | ICD-10-CM | POA: Diagnosis not present

## 2023-06-17 DIAGNOSIS — M9905 Segmental and somatic dysfunction of pelvic region: Secondary | ICD-10-CM | POA: Diagnosis not present

## 2023-06-17 DIAGNOSIS — M9904 Segmental and somatic dysfunction of sacral region: Secondary | ICD-10-CM | POA: Diagnosis not present

## 2023-06-17 DIAGNOSIS — M5136 Other intervertebral disc degeneration, lumbar region: Secondary | ICD-10-CM | POA: Diagnosis not present

## 2023-06-17 DIAGNOSIS — M9903 Segmental and somatic dysfunction of lumbar region: Secondary | ICD-10-CM | POA: Diagnosis not present

## 2023-06-22 DIAGNOSIS — M9903 Segmental and somatic dysfunction of lumbar region: Secondary | ICD-10-CM | POA: Diagnosis not present

## 2023-06-22 DIAGNOSIS — M5136 Other intervertebral disc degeneration, lumbar region: Secondary | ICD-10-CM | POA: Diagnosis not present

## 2023-06-22 DIAGNOSIS — M9905 Segmental and somatic dysfunction of pelvic region: Secondary | ICD-10-CM | POA: Diagnosis not present

## 2023-06-22 DIAGNOSIS — M9904 Segmental and somatic dysfunction of sacral region: Secondary | ICD-10-CM | POA: Diagnosis not present

## 2023-06-30 DIAGNOSIS — M5136 Other intervertebral disc degeneration, lumbar region: Secondary | ICD-10-CM | POA: Diagnosis not present

## 2023-06-30 DIAGNOSIS — M9903 Segmental and somatic dysfunction of lumbar region: Secondary | ICD-10-CM | POA: Diagnosis not present

## 2023-06-30 DIAGNOSIS — M9905 Segmental and somatic dysfunction of pelvic region: Secondary | ICD-10-CM | POA: Diagnosis not present

## 2023-06-30 DIAGNOSIS — M9904 Segmental and somatic dysfunction of sacral region: Secondary | ICD-10-CM | POA: Diagnosis not present

## 2023-07-07 DIAGNOSIS — M9903 Segmental and somatic dysfunction of lumbar region: Secondary | ICD-10-CM | POA: Diagnosis not present

## 2023-07-07 DIAGNOSIS — M9904 Segmental and somatic dysfunction of sacral region: Secondary | ICD-10-CM | POA: Diagnosis not present

## 2023-07-07 DIAGNOSIS — M9905 Segmental and somatic dysfunction of pelvic region: Secondary | ICD-10-CM | POA: Diagnosis not present

## 2023-07-07 DIAGNOSIS — M5136 Other intervertebral disc degeneration, lumbar region: Secondary | ICD-10-CM | POA: Diagnosis not present

## 2023-07-13 DIAGNOSIS — M9904 Segmental and somatic dysfunction of sacral region: Secondary | ICD-10-CM | POA: Diagnosis not present

## 2023-07-13 DIAGNOSIS — M9903 Segmental and somatic dysfunction of lumbar region: Secondary | ICD-10-CM | POA: Diagnosis not present

## 2023-07-13 DIAGNOSIS — M5136 Other intervertebral disc degeneration, lumbar region: Secondary | ICD-10-CM | POA: Diagnosis not present

## 2023-07-13 DIAGNOSIS — M9905 Segmental and somatic dysfunction of pelvic region: Secondary | ICD-10-CM | POA: Diagnosis not present

## 2023-07-21 DIAGNOSIS — M5136 Other intervertebral disc degeneration, lumbar region: Secondary | ICD-10-CM | POA: Diagnosis not present

## 2023-07-21 DIAGNOSIS — M9903 Segmental and somatic dysfunction of lumbar region: Secondary | ICD-10-CM | POA: Diagnosis not present

## 2023-07-21 DIAGNOSIS — M9905 Segmental and somatic dysfunction of pelvic region: Secondary | ICD-10-CM | POA: Diagnosis not present

## 2023-07-21 DIAGNOSIS — M9904 Segmental and somatic dysfunction of sacral region: Secondary | ICD-10-CM | POA: Diagnosis not present

## 2023-07-22 DIAGNOSIS — B353 Tinea pedis: Secondary | ICD-10-CM | POA: Diagnosis not present

## 2023-07-28 DIAGNOSIS — M9905 Segmental and somatic dysfunction of pelvic region: Secondary | ICD-10-CM | POA: Diagnosis not present

## 2023-07-28 DIAGNOSIS — M9904 Segmental and somatic dysfunction of sacral region: Secondary | ICD-10-CM | POA: Diagnosis not present

## 2023-07-28 DIAGNOSIS — M5136 Other intervertebral disc degeneration, lumbar region with discogenic back pain only: Secondary | ICD-10-CM | POA: Diagnosis not present

## 2023-07-28 DIAGNOSIS — M9903 Segmental and somatic dysfunction of lumbar region: Secondary | ICD-10-CM | POA: Diagnosis not present

## 2023-08-11 DIAGNOSIS — M5136 Other intervertebral disc degeneration, lumbar region with discogenic back pain only: Secondary | ICD-10-CM | POA: Diagnosis not present

## 2023-08-11 DIAGNOSIS — M9904 Segmental and somatic dysfunction of sacral region: Secondary | ICD-10-CM | POA: Diagnosis not present

## 2023-08-11 DIAGNOSIS — M9905 Segmental and somatic dysfunction of pelvic region: Secondary | ICD-10-CM | POA: Diagnosis not present

## 2023-08-11 DIAGNOSIS — M9903 Segmental and somatic dysfunction of lumbar region: Secondary | ICD-10-CM | POA: Diagnosis not present

## 2023-08-18 DIAGNOSIS — M5136 Other intervertebral disc degeneration, lumbar region with discogenic back pain only: Secondary | ICD-10-CM | POA: Diagnosis not present

## 2023-08-18 DIAGNOSIS — M9904 Segmental and somatic dysfunction of sacral region: Secondary | ICD-10-CM | POA: Diagnosis not present

## 2023-08-18 DIAGNOSIS — M9905 Segmental and somatic dysfunction of pelvic region: Secondary | ICD-10-CM | POA: Diagnosis not present

## 2023-08-18 DIAGNOSIS — M9903 Segmental and somatic dysfunction of lumbar region: Secondary | ICD-10-CM | POA: Diagnosis not present

## 2023-08-25 DIAGNOSIS — M9904 Segmental and somatic dysfunction of sacral region: Secondary | ICD-10-CM | POA: Diagnosis not present

## 2023-08-25 DIAGNOSIS — M9905 Segmental and somatic dysfunction of pelvic region: Secondary | ICD-10-CM | POA: Diagnosis not present

## 2023-08-25 DIAGNOSIS — M5136 Other intervertebral disc degeneration, lumbar region with discogenic back pain only: Secondary | ICD-10-CM | POA: Diagnosis not present

## 2023-08-25 DIAGNOSIS — M9903 Segmental and somatic dysfunction of lumbar region: Secondary | ICD-10-CM | POA: Diagnosis not present

## 2023-09-01 DIAGNOSIS — M9905 Segmental and somatic dysfunction of pelvic region: Secondary | ICD-10-CM | POA: Diagnosis not present

## 2023-09-01 DIAGNOSIS — M5136 Other intervertebral disc degeneration, lumbar region with discogenic back pain only: Secondary | ICD-10-CM | POA: Diagnosis not present

## 2023-09-01 DIAGNOSIS — M9903 Segmental and somatic dysfunction of lumbar region: Secondary | ICD-10-CM | POA: Diagnosis not present

## 2023-09-01 DIAGNOSIS — M9904 Segmental and somatic dysfunction of sacral region: Secondary | ICD-10-CM | POA: Diagnosis not present

## 2023-09-07 ENCOUNTER — Emergency Department (HOSPITAL_COMMUNITY)
Admission: EM | Admit: 2023-09-07 | Discharge: 2023-09-07 | Disposition: A | Discharge status: Home / Self Care | Payer: PPO | Attending: Emergency Medicine | Admitting: Emergency Medicine

## 2023-09-07 ENCOUNTER — Other Ambulatory Visit: Payer: Self-pay

## 2023-09-07 ENCOUNTER — Emergency Department (HOSPITAL_COMMUNITY): Payer: PPO

## 2023-09-07 ENCOUNTER — Encounter (HOSPITAL_COMMUNITY): Payer: Self-pay

## 2023-09-07 DIAGNOSIS — Z79899 Other long term (current) drug therapy: Secondary | ICD-10-CM | POA: Diagnosis not present

## 2023-09-07 DIAGNOSIS — J9811 Atelectasis: Secondary | ICD-10-CM | POA: Diagnosis not present

## 2023-09-07 DIAGNOSIS — Z7982 Long term (current) use of aspirin: Secondary | ICD-10-CM | POA: Diagnosis not present

## 2023-09-07 DIAGNOSIS — E876 Hypokalemia: Secondary | ICD-10-CM | POA: Insufficient documentation

## 2023-09-07 DIAGNOSIS — R079 Chest pain, unspecified: Secondary | ICD-10-CM | POA: Diagnosis not present

## 2023-09-07 DIAGNOSIS — R0789 Other chest pain: Secondary | ICD-10-CM | POA: Insufficient documentation

## 2023-09-07 LAB — CBC WITH DIFFERENTIAL/PLATELET
Abs Immature Granulocytes: 0.02 10*3/uL (ref 0.00–0.07)
Basophils Absolute: 0.1 10*3/uL (ref 0.0–0.1)
Basophils Relative: 1 %
Eosinophils Absolute: 0.1 10*3/uL (ref 0.0–0.5)
Eosinophils Relative: 1 %
HCT: 47.6 % (ref 39.0–52.0)
Hemoglobin: 15.9 g/dL (ref 13.0–17.0)
Immature Granulocytes: 0 %
Lymphocytes Relative: 24 %
Lymphs Abs: 1.9 10*3/uL (ref 0.7–4.0)
MCH: 31.3 pg (ref 26.0–34.0)
MCHC: 33.4 g/dL (ref 30.0–36.0)
MCV: 93.7 fL (ref 80.0–100.0)
Monocytes Absolute: 0.5 10*3/uL (ref 0.1–1.0)
Monocytes Relative: 6 %
Neutro Abs: 5.4 10*3/uL (ref 1.7–7.7)
Neutrophils Relative %: 68 %
Platelets: 167 10*3/uL (ref 150–400)
RBC: 5.08 MIL/uL (ref 4.22–5.81)
RDW: 13.2 % (ref 11.5–15.5)
WBC: 7.9 10*3/uL (ref 4.0–10.5)
nRBC: 0 % (ref 0.0–0.2)

## 2023-09-07 LAB — COMPREHENSIVE METABOLIC PANEL
ALT: 21 U/L (ref 0–44)
AST: 30 U/L (ref 15–41)
Albumin: 4.3 g/dL (ref 3.5–5.0)
Alkaline Phosphatase: 74 U/L (ref 38–126)
Anion gap: 14 (ref 5–15)
BUN: 14 mg/dL (ref 8–23)
CO2: 24 mmol/L (ref 22–32)
Calcium: 9.2 mg/dL (ref 8.9–10.3)
Chloride: 105 mmol/L (ref 98–111)
Creatinine, Ser: 0.92 mg/dL (ref 0.61–1.24)
GFR, Estimated: >60 mL/min (ref >60)
Glucose, Bld: 87 mg/dL (ref 70–99)
Potassium: 3.1 mmol/L — ABNORMAL LOW (ref 3.5–5.1)
Sodium: 143 mmol/L (ref 135–145)
Total Bilirubin: 1.8 mg/dL — ABNORMAL HIGH (ref <1.2)
Total Protein: 7.2 g/dL (ref 6.5–8.1)

## 2023-09-07 LAB — TROPONIN I (HIGH SENSITIVITY)
Troponin I (High Sensitivity): 8 ng/L (ref <18)
Troponin I (High Sensitivity): 9 ng/L (ref <18)

## 2023-09-07 MED ORDER — POTASSIUM CHLORIDE CRYS ER 20 MEQ PO TBCR
40.0000 meq | EXTENDED_RELEASE_TABLET | Freq: Once | ORAL | Status: AC
  Administered 2023-09-07: 40 meq via ORAL
  Filled 2023-09-07: qty 4

## 2023-09-07 NOTE — ED Provider Notes (Signed)
[place] EMERGENCY DEPARTMENT AT [facility] Provider Note   CSN: [id number] Arrival date & time: 09/07/23  1828     History  Chief Complaint  Patient presents with   Chest Pain    [name] is a 82 y.o. male.  82 year old male with prior medical history as detailed below presents for evaluation.  Patient reported to his PCP earlier today constant chest pain x 2 weeks.  EKG obtained in the clinic suggested right bundle branch block.  This appears to be a new finding.  Patient was sent to the ED for further evaluation.  Patient without recent cardiac evaluation.  Patient is denying associated symptoms such as nausea, vomiting, diaphoresis, shortness of breath.    The history is provided by the patient and medical records.       Home Medications Prior to Admission medications   Medication Sig Start Date End Date Taking? Authorizing Provider  Ascorbic Acid (VITAMIN C) 1000 MG tablet Take 1,000 mg by mouth every morning.    [provider]  aspirin 81 MG tablet Take 81 mg by mouth daily.    [provider]  atorvastatin (LIPITOR) 20 MG tablet Take 20 mg by mouth daily at 6 PM. 01/22/16   [provider]  Glucosamine 750 MG TABS Take 1 tablet by mouth every morning.    [provider]  hydrochlorothiazide (MICROZIDE) 12.5 MG capsule Take 1 capsule (12.5 mg total) by mouth daily. 03/27/16   Lyn Records, MD  ibuprofen (ADVIL,MOTRIN) 200 MG tablet Take 400 mg by mouth every 8 (eight) hours as needed for headache or mild pain.    [provider]  lisinopril (PRINIVIL,ZESTRIL) 40 MG tablet Take 40 mg by mouth every morning.    [provider]  loratadine (CLARITIN) 10 MG tablet Take 10 mg by mouth daily as needed for allergies.    [provider]  Magnesium Oxide 400 (240 MG) MG TABS Take 400 mg by mouth 2 (two) times daily. 09/09/15   Leone Brand, NP  Multiple Vitamin (MULTIVITAMIN WITH  MINERALS) TABS tablet Take 1 tablet by mouth every morning.    [provider]  Omega-3 Fatty Acids (OMEGA 3 PO) Take 1 tablet by mouth every morning.    [provider]  Probiotic Product (PROBIOTIC DAILY PO) Take 1 capsule by mouth daily.    [provider]  tadalafil (CIALIS) 5 MG tablet Take 5 mg by mouth every evening.    [provider]  traMADol (ULTRAM) 50 MG tablet Take 1 tablet (50 mg total) by mouth every 8 (eight) hours as needed. 03/09/17   Tarry Kos, MD  verapamil (CALAN-SR) 240 MG CR tablet Take 1 tablet (240 mg total) by mouth daily. 03/27/16   Lyn Records, MD      Allergies    Patient has no known allergies.    Review of Systems   Review of Systems  All other systems reviewed and are negative.   Physical Exam Updated Vital Signs BP (!) 144/81 (BP Location: Right Arm)   Pulse 63   Temp 98.3 F (36.8 C) (Oral)   Resp 18   Wt 88 kg   SpO2 95%   BMI 27.84 kg/m  Physical Exam Vitals and nursing note reviewed.  Constitutional:      General: He is not in acute distress.    Appearance: Normal appearance. He is well-developed.  HENT:     Head: Normocephalic and atraumatic.  Eyes:     Conjunctiva/sclera: Conjunctivae normal.     Pupils: Pupils are equal, round, and reactive to light.  Cardiovascular:     Rate and Rhythm: Normal rate and regular rhythm.     Heart sounds: Normal heart sounds.  Pulmonary:     Effort: Pulmonary effort is normal. No respiratory distress.     Breath sounds: Normal breath sounds.  Abdominal:     General: There is no distension.     Palpations: Abdomen is soft.     Tenderness: There is no abdominal tenderness.  Musculoskeletal:        General: No deformity. Normal range of motion.     Cervical back: Normal range of motion and neck supple.  Skin:    General: Skin is warm and dry.  Neurological:     General: No focal deficit present.     Mental Status: He is alert and oriented to person,  place, and time.     ED Results / Procedures / Treatments   Labs (all labs ordered are listed, but only abnormal results are displayed) Labs Reviewed  COMPREHENSIVE METABOLIC PANEL - Abnormal; Notable for the following components:      Result Value   Potassium 3.1 (*)    Total Bilirubin 1.8 (*)    All other components within normal limits  CBC WITH DIFFERENTIAL/PLATELET  TROPONIN I (HIGH SENSITIVITY)  TROPONIN I (HIGH SENSITIVITY)    EKG EKG Interpretation Date/Time:  Tuesday September 07 2023 18:47:13 EST Ventricular Rate:  60 PR Interval:  145 QRS Duration:  152 QT Interval:  466 QTC Calculation: 466 R Axis:   244  Text Interpretation: Sinus rhythm RBBB and LAFB No significant change since last tracing Confirmed by Melene Plan [phone]) on 09/07/2023 7:02:05 PM  Radiology No results found.  Procedures Procedures    Medications Ordered in ED Medications  potassium chloride SA (KLOR-CON M) CR tablet 40 mEq (40 mEq Oral Given 09/07/23 2050)    ED Course/ Medical Decision Making/ A&P                                 Medical Decision Making Risk Prescription drug management.    Medical Screen Complete  This patient presented to the ED with complaint of chest pain.  This complaint involves an extensive number of treatment options. The initial differential diagnosis includes, but is not limited to, ACS, angina, metabolic abnormality, GERD, etc.  This presentation is: Acute, Chronic, Self-Limited, Previously Undiagnosed, Uncertain Prognosis, Complicated, Systemic Symptoms, and Threat to Life/Bodily Function  Patient is presenting with constant chest pain x 2 weeks.  Patient without known prior significant cardiac disease.  Initial EKG demonstrates likely right bundle branch block.  Initial troponin is 8.  Other screening labs are without significant abnormality other than potassium 3.1.  Chest x-ray is without significant acute abnormality.  Patient  offered admission for further cardiac workup.  He declined same.  He prefers to go home.  Outpatient cardiology referral made.  Patient understands need for close outpatient follow-up and strict return precautions given and understood.    Additional history obtained:  External records from outside sources obtained and reviewed including prior ED visits and prior Inpatient records.    Lab Tests:  I ordered and personally interpreted labs.  The pertinent results include: CBC, troponin x 2, CMP   Imaging Studies ordered:  I ordered imaging studies including chest x-ray I independently visualized  and interpreted obtained imaging which showed NAD I agree with the radiologist interpretation.   Cardiac Monitoring:  The patient was maintained on a cardiac monitor.  I personally viewed and interpreted the cardiac monitor which showed an underlying rhythm of: NSR   Medicines ordered:  I ordered medication including potassium for hypokalemia Reevaluation of the patient after these medicines showed that the patient: improved    Problem List / ED Course:  Chest pain, hypokalemia   Reevaluation:  After the interventions noted above, I reevaluated the patient and found that they have: improved  Disposition:  After consideration of the diagnostic results and the patients response to treatment, I feel that the patent would benefit from close outpatient follow-up.          Final Clinical Impression(s) / ED Diagnoses Final diagnoses:  Atypical chest pain    Rx / DC Orders ED Discharge Orders     None         Wynetta Fines, MD 09/07/23 2254

## 2023-09-07 NOTE — ED Triage Notes (Addendum)
Pt referred by ED for abnormal EKG.  Pt reports centralized cp x2 weeks.  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  Denies sob. Denies radiation.

## 2023-09-07 NOTE — ED Provider Triage Note (Signed)
Emergency Medicine Provider Triage Evaluation Note  [name] , a 82 y.o. male  was evaluated in triage.  Pt complains of chest pain.  Review of Systems  Positive: CP,  Negative: SOB, nausea  Physical Exam  BP (!) 149/89 (BP Location: Left Arm)   Pulse 60   Temp 97.7 F (36.5 C) (Oral)   Resp 16   Wt 88 kg   SpO2 99%   BMI 27.84 kg/m  Gen:   Awake, no distress   Resp:  Normal effort  MSK:   Moves extremities without difficulty  Other:    Medical Decision Making  Medically screening exam initiated at 6:56 PM.  Appropriate orders placed.  Catha Nottingham was informed that the remainder of the evaluation will be completed by another provider, this initial triage assessment does not replace that evaluation, and the importance of remaining in the ED until their evaluation is complete.  CP daily x 2 weeks, exertion seems to make it worse. No SOB, nausea.    Elpidio Anis, PA-C 09/07/23 1858

## 2023-09-07 NOTE — Discharge Instructions (Addendum)
Return for any problem.  ?

## 2023-09-08 DIAGNOSIS — M9905 Segmental and somatic dysfunction of pelvic region: Secondary | ICD-10-CM | POA: Diagnosis not present

## 2023-09-08 DIAGNOSIS — M9904 Segmental and somatic dysfunction of sacral region: Secondary | ICD-10-CM | POA: Diagnosis not present

## 2023-09-08 DIAGNOSIS — M9903 Segmental and somatic dysfunction of lumbar region: Secondary | ICD-10-CM | POA: Diagnosis not present

## 2023-09-08 DIAGNOSIS — M5136 Other intervertebral disc degeneration, lumbar region with discogenic back pain only: Secondary | ICD-10-CM | POA: Diagnosis not present

## 2023-09-15 DIAGNOSIS — M9903 Segmental and somatic dysfunction of lumbar region: Secondary | ICD-10-CM | POA: Diagnosis not present

## 2023-09-15 DIAGNOSIS — M9905 Segmental and somatic dysfunction of pelvic region: Secondary | ICD-10-CM | POA: Diagnosis not present

## 2023-09-15 DIAGNOSIS — M9904 Segmental and somatic dysfunction of sacral region: Secondary | ICD-10-CM | POA: Diagnosis not present

## 2023-09-15 DIAGNOSIS — M5136 Other intervertebral disc degeneration, lumbar region with discogenic back pain only: Secondary | ICD-10-CM | POA: Diagnosis not present

## 2023-09-16 DIAGNOSIS — I4510 Unspecified right bundle-branch block: Secondary | ICD-10-CM | POA: Diagnosis not present

## 2023-09-16 DIAGNOSIS — K219 Gastro-esophageal reflux disease without esophagitis: Secondary | ICD-10-CM | POA: Diagnosis not present

## 2023-09-16 DIAGNOSIS — R079 Chest pain, unspecified: Secondary | ICD-10-CM | POA: Diagnosis not present

## 2023-09-20 DIAGNOSIS — M48062 Spinal stenosis, lumbar region with neurogenic claudication: Secondary | ICD-10-CM | POA: Diagnosis not present

## 2023-09-22 DIAGNOSIS — M9904 Segmental and somatic dysfunction of sacral region: Secondary | ICD-10-CM | POA: Diagnosis not present

## 2023-09-22 DIAGNOSIS — M9903 Segmental and somatic dysfunction of lumbar region: Secondary | ICD-10-CM | POA: Diagnosis not present

## 2023-09-22 DIAGNOSIS — M5136 Other intervertebral disc degeneration, lumbar region with discogenic back pain only: Secondary | ICD-10-CM | POA: Diagnosis not present

## 2023-09-22 DIAGNOSIS — M9905 Segmental and somatic dysfunction of pelvic region: Secondary | ICD-10-CM | POA: Diagnosis not present

## 2023-09-29 DIAGNOSIS — M9903 Segmental and somatic dysfunction of lumbar region: Secondary | ICD-10-CM | POA: Diagnosis not present

## 2023-09-29 DIAGNOSIS — M9905 Segmental and somatic dysfunction of pelvic region: Secondary | ICD-10-CM | POA: Diagnosis not present

## 2023-09-29 DIAGNOSIS — M5136 Other intervertebral disc degeneration, lumbar region with discogenic back pain only: Secondary | ICD-10-CM | POA: Diagnosis not present

## 2023-09-29 DIAGNOSIS — M9904 Segmental and somatic dysfunction of sacral region: Secondary | ICD-10-CM | POA: Diagnosis not present

## 2023-10-13 DIAGNOSIS — M9904 Segmental and somatic dysfunction of sacral region: Secondary | ICD-10-CM | POA: Diagnosis not present

## 2023-10-13 DIAGNOSIS — M5136 Other intervertebral disc degeneration, lumbar region with discogenic back pain only: Secondary | ICD-10-CM | POA: Diagnosis not present

## 2023-10-13 DIAGNOSIS — M9903 Segmental and somatic dysfunction of lumbar region: Secondary | ICD-10-CM | POA: Diagnosis not present

## 2023-10-13 DIAGNOSIS — M9905 Segmental and somatic dysfunction of pelvic region: Secondary | ICD-10-CM | POA: Diagnosis not present

## 2023-10-26 ENCOUNTER — Ambulatory Visit: Payer: PPO | Admitting: Cardiovascular Disease

## 2023-10-26 DIAGNOSIS — M9904 Segmental and somatic dysfunction of sacral region: Secondary | ICD-10-CM | POA: Diagnosis not present

## 2023-10-26 DIAGNOSIS — M9903 Segmental and somatic dysfunction of lumbar region: Secondary | ICD-10-CM | POA: Diagnosis not present

## 2023-10-26 DIAGNOSIS — M9905 Segmental and somatic dysfunction of pelvic region: Secondary | ICD-10-CM | POA: Diagnosis not present

## 2023-10-26 DIAGNOSIS — M5136 Other intervertebral disc degeneration, lumbar region with discogenic back pain only: Secondary | ICD-10-CM | POA: Diagnosis not present

## 2023-11-10 DIAGNOSIS — M51362 Other intervertebral disc degeneration, lumbar region with discogenic back pain and lower extremity pain: Secondary | ICD-10-CM | POA: Diagnosis not present

## 2023-11-10 DIAGNOSIS — M9904 Segmental and somatic dysfunction of sacral region: Secondary | ICD-10-CM | POA: Diagnosis not present

## 2023-11-10 DIAGNOSIS — M9905 Segmental and somatic dysfunction of pelvic region: Secondary | ICD-10-CM | POA: Diagnosis not present

## 2023-11-10 DIAGNOSIS — M9903 Segmental and somatic dysfunction of lumbar region: Secondary | ICD-10-CM | POA: Diagnosis not present

## 2023-11-12 ENCOUNTER — Encounter: Payer: Self-pay | Admitting: Cardiology

## 2023-11-12 ENCOUNTER — Ambulatory Visit: Payer: PPO | Attending: Cardiology | Admitting: Cardiology

## 2023-11-12 VITALS — BP 120/64 | HR 65 | Ht 70.0 in

## 2023-11-12 DIAGNOSIS — R079 Chest pain, unspecified: Secondary | ICD-10-CM

## 2023-11-12 MED ORDER — METOPROLOL TARTRATE 50 MG PO TABS: 50.0000 mg | ORAL_TABLET | Freq: Once | ORAL | 0 refills | Status: AC

## 2023-11-12 NOTE — Patient Instructions (Signed)
Medication Instructions:  The current medical regimen is effective;  continue present plan and medications.  *If you need a refill on your cardiac medications before your next appointment, please call your pharmacy*   Testing/Procedures:   Your cardiac CT will be scheduled at:   [facility] [address] [place], [place] [address] [phone]  Please arrive at the [facility] and Children's Entrance (Entrance C2) of [facility] 30 minutes prior to test start time. You can use the FREE valet parking offered at entrance C (encouraged to control the heart rate for the test)  Proceed to the [facility] Radiology Department (first floor) to check-in and test prep.  All radiology patients and guests should use entrance C2 at [facility], accessed from [facility], even though the hospital's physical address listed is [address]     Please follow these instructions carefully (unless otherwise directed):  An IV will be required for this test and Nitroglycerin will be given.  Hold all erectile dysfunction medications at least 3 days (72 hrs) prior to test. (Ie viagra, cialis, sildenafil, tadalafil, etc)   On the Night Before the Test: Be sure to Drink plenty of water. Do not consume any caffeinated/decaffeinated beverages or chocolate 12 hours prior to your test. Do not take any antihistamines 12 hours prior to your test.  On the Day of the Test: Drink plenty of water until 1 hour prior to the test. Do not eat any food 1 hour prior to test. You may take your regular medications prior to the test.  Take metoprolol (Lopressor) two hours prior to test. If you take Furosemide/Hydrochlorothiazide/Spironolactone/Chlorthalidone, please HOLD on the morning of the test. Patients who wear a continuous glucose monitor MUST remove the device prior to scanning      After the Test: Drink plenty of water. After receiving IV contrast, you may  experience a mild flushed feeling. This is normal. On occasion, you may experience a mild rash up to 24 hours after the test. This is not dangerous. If this occurs, you can take Benadryl 25 mg and increase your fluid intake. If you experience trouble breathing, this can be serious. If it is severe call 911 IMMEDIATELY. If it is mild, please call our office.  We will call to schedule your test 2-4 weeks out understanding that some insurance companies will need an authorization prior to the service being performed.   For more information and frequently asked questions, please visit our website : [email or web address]  For non-scheduling related questions, please contact the cardiac imaging nurse navigator should you have any questions/concerns: Cardiac Imaging Nurse Navigators Direct Office Dial: [phone]   For scheduling needs, including cancellations and rescheduling, please call [place], [phone].   Follow-Up: At [facility], you and your health needs are our priority.  As part of our continuing mission to provide you with exceptional heart care, we have created designated Provider Care Teams.  These Care Teams include your primary Cardiologist (physician) and Advanced Practice Providers (APPs -  Physician Assistants and Nurse Practitioners) who all work together to provide you with the care you need, when you need it.  We recommend signing up for the patient portal called "MyChart".  Sign up information is provided on this After Visit Summary.  MyChart is used to connect with patients for Virtual Visits (Telemedicine).  Patients are able to view lab/test results, encounter notes, upcoming appointments, etc.  Non-urgent messages can be sent to your provider as  well.   To learn more about what you can do with MyChart, go to [email or web address].    Your next appointment:   Follow up will be based on the results of the above testing.

## 2023-11-12 NOTE — Progress Notes (Signed)
Cardiology Office Note:  .   Date:  11/12/2023  ID:  [name], DOB [birth year], MRN [id number] PCP: Lupita Raider, MD  [facility] Providers Cardiologist:  None     History of Present Illness: .   [name] is a 83 y.o. male Discussed with the use of AI scribe  History of Present Illness   The patient, an 83 year old with a known history of right bundle branch block, presented for evaluation of chest pain. The pain, described as a lot of pressure, had been ongoing for two weeks prior to his visit to the emergency department on 09/07/23. The patient reported that this sensation was unusual and unlike anything he had previously experienced, although he did recall a similar episode several years ago that resulted in an ER visit and subsequent treadmill testing, which returned normal results.  The patient also mentioned a history of cortisone shots for back pain, and questioned whether this could be related to his chest pain. He reported an episode of night sweats, during which he felt unwell but did not want to visit the emergency room. During this episode, he took an aspirin and his symptoms subsided.  The patient has a long-standing history of right bundle branch block, first discovered when he was younger and more physically active. He has not seen a cardiologist for some time. The patient's chest pain was not associated with exertion and lasted for a significant duration.  The patient's partner also reported an episode where the patient woke up in the night with pain and was sweating profusely. After taking an aspirin, the patient's symptoms improved and he did not seek emergency care. The patient's partner also mentioned that the patient had a coronary CT scan, which showed minimal calcium.           Studies Reviewed: Marland Kitchen   EKG Interpretation Date/Time:  Friday November 12 2023 09:54:22 EST Ventricular Rate:  65 PR Interval:  144 QRS Duration:  142 QT  Interval:  432 QTC Calculation: 449 R Axis:   -68  Text Interpretation: Normal sinus rhythm Left axis deviation Right bundle branch block When compared with ECG of 07-Sep-2023 18:47, No significant change since last tracing Confirmed by Donato Schultz ([id number]) on 11/12/2023 10:03:25 AM    Results   LABS Troponins: Negative times two (09/07/2023) Creatinine: 0.92 (09/07/2023)  RADIOLOGY Chest X-ray: Minimal left base atelectasis, otherwise unremarkable (09/07/2023)  DIAGNOSTIC REPORTS EKG: Right bundle branch block, left anterior fascicular block (09/07/2023)     Risk Assessment/Calculations:            Physical Exam:   VS:  BP 120/64   Pulse 65   Ht 5\' 10"  (1.778 m)   SpO2 95%   BMI 27.84 kg/m    Wt Readings from Last 3 Encounters:  09/07/23 194 lb 0.1 oz (88 kg)  03/27/16 195 lb 12.8 oz (88.8 kg)  09/23/15 202 lb 1.9 oz (91.7 kg)    GEN: Well nourished, well developed in no acute distress NECK: No JVD; No carotid bruits CARDIAC: RRR, no murmurs, no rubs, no gallops RESPIRATORY:  Clear to auscultation without rales, wheezing or rhonchi  ABDOMEN: Soft, non-tender, non-distended EXTREMITIES:  No edema; No deformity   ASSESSMENT AND PLAN: .    Assessment and Plan    Chest Pain 83 year old with chest pain for two weeks, initially evaluated by primary care with EKG showing right bundle branch block and left anterior fascicular block. ER evaluation revealed negative troponins, creatinine 0.92,  and chest x-ray with minimal left base atelectasis. Symptoms included pressure and sweating, with a similar episode several years ago. Differential diagnosis includes cardiac etiology versus gastroesophageal reflux disease (GERD). Discussed coronary CT scan to evaluate for significant plaque, stenosis, or blockage. Previous calcium score was minimal. Explained that GERD can mimic myocardial infarction symptoms. Patient agreed to proceed with the coronary CT scan. - Order coronary CT scan to  evaluate for significant plaque, stenosis, or blockage - PRN follow-up.               Signed, Donato Schultz, MD

## 2023-11-17 DIAGNOSIS — M48062 Spinal stenosis, lumbar region with neurogenic claudication: Secondary | ICD-10-CM | POA: Diagnosis not present

## 2023-11-23 DIAGNOSIS — L821 Other seborrheic keratosis: Secondary | ICD-10-CM | POA: Diagnosis not present

## 2023-11-23 DIAGNOSIS — D1801 Hemangioma of skin and subcutaneous tissue: Secondary | ICD-10-CM | POA: Diagnosis not present

## 2023-11-23 DIAGNOSIS — Z85828 Personal history of other malignant neoplasm of skin: Secondary | ICD-10-CM | POA: Diagnosis not present

## 2023-11-23 DIAGNOSIS — Z85820 Personal history of malignant melanoma of skin: Secondary | ICD-10-CM | POA: Diagnosis not present

## 2023-11-24 ENCOUNTER — Telehealth (HOSPITAL_COMMUNITY): Payer: Self-pay | Admitting: *Deleted

## 2023-11-24 DIAGNOSIS — M51362 Other intervertebral disc degeneration, lumbar region with discogenic back pain and lower extremity pain: Secondary | ICD-10-CM | POA: Diagnosis not present

## 2023-11-24 DIAGNOSIS — M9904 Segmental and somatic dysfunction of sacral region: Secondary | ICD-10-CM | POA: Diagnosis not present

## 2023-11-24 DIAGNOSIS — M9905 Segmental and somatic dysfunction of pelvic region: Secondary | ICD-10-CM | POA: Diagnosis not present

## 2023-11-24 DIAGNOSIS — M9903 Segmental and somatic dysfunction of lumbar region: Secondary | ICD-10-CM | POA: Diagnosis not present

## 2023-11-24 NOTE — Telephone Encounter (Signed)
Reaching out to patient to offer assistance regarding upcoming cardiac imaging study; pt verbalizes understanding of appt date/time, parking situation and where to check in, pre-test NPO status and medications ordered, and verified current allergies; name and call back number provided for further questions should they arise Johney Frame RN Navigator Cardiac Imaging [facility] [phone] office [phone] cell

## 2023-11-25 ENCOUNTER — Ambulatory Visit (HOSPITAL_COMMUNITY)
Admission: RE | Admit: 2023-11-25 | Discharge: 2023-11-25 | Disposition: A | Discharge status: Home / Self Care | Payer: PPO | Source: Ambulatory Visit | Attending: Cardiology | Admitting: Cardiology

## 2023-11-25 ENCOUNTER — Other Ambulatory Visit: Payer: Self-pay | Admitting: Cardiovascular Disease

## 2023-11-25 ENCOUNTER — Other Ambulatory Visit (HOSPITAL_COMMUNITY): Payer: PPO

## 2023-11-25 ENCOUNTER — Ambulatory Visit (HOSPITAL_COMMUNITY)
Admission: RE | Admit: 2023-11-25 | Discharge: 2023-11-25 | Disposition: A | Discharge status: Home / Self Care | Payer: PPO | Source: Ambulatory Visit | Attending: Cardiovascular Disease | Admitting: Cardiovascular Disease

## 2023-11-25 DIAGNOSIS — R079 Chest pain, unspecified: Secondary | ICD-10-CM | POA: Insufficient documentation

## 2023-11-25 DIAGNOSIS — R931 Abnormal findings on diagnostic imaging of heart and coronary circulation: Secondary | ICD-10-CM | POA: Diagnosis not present

## 2023-11-25 DIAGNOSIS — I2510 Atherosclerotic heart disease of native coronary artery without angina pectoris: Secondary | ICD-10-CM | POA: Insufficient documentation

## 2023-11-25 DIAGNOSIS — I2584 Coronary atherosclerosis due to calcified coronary lesion: Secondary | ICD-10-CM | POA: Diagnosis not present

## 2023-11-25 DIAGNOSIS — Q2112 Patent foramen ovale: Secondary | ICD-10-CM | POA: Insufficient documentation

## 2023-11-25 MED ORDER — IOHEXOL 350 MG/ML SOLN
190.0000 mL | Freq: Once | INTRAVENOUS | Status: AC | PRN
  Administered 2023-11-25: 190 mL via INTRAVENOUS

## 2023-11-25 MED ORDER — DILTIAZEM HCL 25 MG/5ML IV SOLN: 10.0000 mg | INTRAVENOUS | Status: DC | PRN

## 2023-11-25 MED ORDER — METOPROLOL TARTRATE 5 MG/5ML IV SOLN: 10.0000 mg | Freq: Once | INTRAVENOUS | Status: DC | PRN

## 2023-11-25 MED ORDER — NITROGLYCERIN 0.4 MG SL SUBL
SUBLINGUAL_TABLET | SUBLINGUAL | Status: AC
Start: 2023-11-25 — End: ?
  Filled 2023-11-25: qty 2

## 2023-11-25 MED ORDER — NITROGLYCERIN 0.4 MG SL SUBL
0.8000 mg | SUBLINGUAL_TABLET | Freq: Once | SUBLINGUAL | Status: AC
  Administered 2023-11-25: 0.8 mg via SUBLINGUAL

## 2023-11-25 NOTE — Progress Notes (Signed)
CT FFR ordered.  [name] Lipps, MD, [facility]  [facility]  [address] [place], [place] [address] [phone]  9:16 PM

## 2023-12-08 DIAGNOSIS — M9904 Segmental and somatic dysfunction of sacral region: Secondary | ICD-10-CM | POA: Diagnosis not present

## 2023-12-08 DIAGNOSIS — M9905 Segmental and somatic dysfunction of pelvic region: Secondary | ICD-10-CM | POA: Diagnosis not present

## 2023-12-08 DIAGNOSIS — M51362 Other intervertebral disc degeneration, lumbar region with discogenic back pain and lower extremity pain: Secondary | ICD-10-CM | POA: Diagnosis not present

## 2023-12-08 DIAGNOSIS — M9903 Segmental and somatic dysfunction of lumbar region: Secondary | ICD-10-CM | POA: Diagnosis not present

## 2023-12-20 DIAGNOSIS — M9905 Segmental and somatic dysfunction of pelvic region: Secondary | ICD-10-CM | POA: Diagnosis not present

## 2023-12-20 DIAGNOSIS — M9903 Segmental and somatic dysfunction of lumbar region: Secondary | ICD-10-CM | POA: Diagnosis not present

## 2023-12-20 DIAGNOSIS — M9904 Segmental and somatic dysfunction of sacral region: Secondary | ICD-10-CM | POA: Diagnosis not present

## 2023-12-20 DIAGNOSIS — M51362 Other intervertebral disc degeneration, lumbar region with discogenic back pain and lower extremity pain: Secondary | ICD-10-CM | POA: Diagnosis not present

## 2023-12-21 DIAGNOSIS — M48062 Spinal stenosis, lumbar region with neurogenic claudication: Secondary | ICD-10-CM | POA: Diagnosis not present

## 2023-12-29 DIAGNOSIS — M9903 Segmental and somatic dysfunction of lumbar region: Secondary | ICD-10-CM | POA: Diagnosis not present

## 2023-12-29 DIAGNOSIS — I2510 Atherosclerotic heart disease of native coronary artery without angina pectoris: Secondary | ICD-10-CM | POA: Diagnosis not present

## 2023-12-29 DIAGNOSIS — N529 Male erectile dysfunction, unspecified: Secondary | ICD-10-CM | POA: Diagnosis not present

## 2023-12-29 DIAGNOSIS — M4800 Spinal stenosis, site unspecified: Secondary | ICD-10-CM | POA: Diagnosis not present

## 2023-12-29 DIAGNOSIS — M9904 Segmental and somatic dysfunction of sacral region: Secondary | ICD-10-CM | POA: Diagnosis not present

## 2023-12-29 DIAGNOSIS — I10 Essential (primary) hypertension: Secondary | ICD-10-CM | POA: Diagnosis not present

## 2023-12-29 DIAGNOSIS — M51362 Other intervertebral disc degeneration, lumbar region with discogenic back pain and lower extremity pain: Secondary | ICD-10-CM | POA: Diagnosis not present

## 2023-12-29 DIAGNOSIS — Z0000 Encounter for general adult medical examination without abnormal findings: Secondary | ICD-10-CM | POA: Diagnosis not present

## 2023-12-29 DIAGNOSIS — E782 Mixed hyperlipidemia: Secondary | ICD-10-CM | POA: Diagnosis not present

## 2023-12-29 DIAGNOSIS — K219 Gastro-esophageal reflux disease without esophagitis: Secondary | ICD-10-CM | POA: Diagnosis not present

## 2023-12-29 DIAGNOSIS — N400 Enlarged prostate without lower urinary tract symptoms: Secondary | ICD-10-CM | POA: Diagnosis not present

## 2023-12-29 DIAGNOSIS — M9905 Segmental and somatic dysfunction of pelvic region: Secondary | ICD-10-CM | POA: Diagnosis not present

## 2023-12-29 DIAGNOSIS — M1990 Unspecified osteoarthritis, unspecified site: Secondary | ICD-10-CM | POA: Diagnosis not present

## 2023-12-29 LAB — LAB REPORT - SCANNED
Creatinine, POC: 66 mg/dL
EGFR: 70

## 2023-12-30 ENCOUNTER — Encounter: Payer: Self-pay | Admitting: Family Medicine

## 2024-01-04 ENCOUNTER — Other Ambulatory Visit: Payer: Self-pay | Admitting: *Deleted

## 2024-01-04 DIAGNOSIS — M9903 Segmental and somatic dysfunction of lumbar region: Secondary | ICD-10-CM | POA: Diagnosis not present

## 2024-01-04 DIAGNOSIS — E785 Hyperlipidemia, unspecified: Secondary | ICD-10-CM

## 2024-01-04 DIAGNOSIS — M9904 Segmental and somatic dysfunction of sacral region: Secondary | ICD-10-CM | POA: Diagnosis not present

## 2024-01-04 DIAGNOSIS — M9905 Segmental and somatic dysfunction of pelvic region: Secondary | ICD-10-CM | POA: Diagnosis not present

## 2024-01-04 DIAGNOSIS — Z79899 Other long term (current) drug therapy: Secondary | ICD-10-CM

## 2024-01-04 DIAGNOSIS — M51362 Other intervertebral disc degeneration, lumbar region with discogenic back pain and lower extremity pain: Secondary | ICD-10-CM | POA: Diagnosis not present

## 2024-01-04 MED ORDER — ROSUVASTATIN CALCIUM 20 MG PO TABS: 20.0000 mg | ORAL_TABLET | Freq: Every day | ORAL | 3 refills | Status: AC

## 2024-01-20 DIAGNOSIS — M9903 Segmental and somatic dysfunction of lumbar region: Secondary | ICD-10-CM | POA: Diagnosis not present

## 2024-01-20 DIAGNOSIS — M9904 Segmental and somatic dysfunction of sacral region: Secondary | ICD-10-CM | POA: Diagnosis not present

## 2024-01-20 DIAGNOSIS — M9905 Segmental and somatic dysfunction of pelvic region: Secondary | ICD-10-CM | POA: Diagnosis not present

## 2024-01-20 DIAGNOSIS — M51362 Other intervertebral disc degeneration, lumbar region with discogenic back pain and lower extremity pain: Secondary | ICD-10-CM | POA: Diagnosis not present

## 2024-01-27 DIAGNOSIS — H43813 Vitreous degeneration, bilateral: Secondary | ICD-10-CM | POA: Diagnosis not present

## 2024-01-27 DIAGNOSIS — H35373 Puckering of macula, bilateral: Secondary | ICD-10-CM | POA: Diagnosis not present

## 2024-01-27 DIAGNOSIS — D3132 Benign neoplasm of left choroid: Secondary | ICD-10-CM | POA: Diagnosis not present

## 2024-01-27 DIAGNOSIS — H26493 Other secondary cataract, bilateral: Secondary | ICD-10-CM | POA: Diagnosis not present

## 2024-02-01 DIAGNOSIS — H26492 Other secondary cataract, left eye: Secondary | ICD-10-CM | POA: Diagnosis not present

## 2024-02-07 DIAGNOSIS — M9904 Segmental and somatic dysfunction of sacral region: Secondary | ICD-10-CM | POA: Diagnosis not present

## 2024-02-07 DIAGNOSIS — M9903 Segmental and somatic dysfunction of lumbar region: Secondary | ICD-10-CM | POA: Diagnosis not present

## 2024-02-07 DIAGNOSIS — M9905 Segmental and somatic dysfunction of pelvic region: Secondary | ICD-10-CM | POA: Diagnosis not present

## 2024-02-07 DIAGNOSIS — M5136 Other intervertebral disc degeneration, lumbar region with discogenic back pain only: Secondary | ICD-10-CM | POA: Diagnosis not present

## 2024-02-08 ENCOUNTER — Telehealth: Payer: Self-pay | Admitting: Cardiology

## 2024-02-08 NOTE — Telephone Encounter (Signed)
 Advised to have lab repeated as instructed 2 months after he started crestor.  Wife is aware pt to have blood work at his closet [facility] around 5/18.  Order is present in [facility]

## 2024-02-08 NOTE — Telephone Encounter (Signed)
 New Message:       Wife said patient started takin Rosuvastatin on3-18-25. She know he was supposed to follow up with lab work. She wants to know when is patient supposed to have his lab work?

## 2024-02-08 NOTE — Telephone Encounter (Signed)
 Spoke to patient's wife she stated husband has been taking Rosuvastatin 20 mg since 3/18.She wanted to know when he needs to repeat lipid panel.Advised I will send message to Dr.Skain's RN.

## 2024-02-17 DIAGNOSIS — M9903 Segmental and somatic dysfunction of lumbar region: Secondary | ICD-10-CM | POA: Diagnosis not present

## 2024-02-17 DIAGNOSIS — M5136 Other intervertebral disc degeneration, lumbar region with discogenic back pain only: Secondary | ICD-10-CM | POA: Diagnosis not present

## 2024-02-17 DIAGNOSIS — M9905 Segmental and somatic dysfunction of pelvic region: Secondary | ICD-10-CM | POA: Diagnosis not present

## 2024-02-17 DIAGNOSIS — M9904 Segmental and somatic dysfunction of sacral region: Secondary | ICD-10-CM | POA: Diagnosis not present

## 2024-02-22 DIAGNOSIS — H26491 Other secondary cataract, right eye: Secondary | ICD-10-CM | POA: Diagnosis not present

## 2024-03-08 DIAGNOSIS — M5416 Radiculopathy, lumbar region: Secondary | ICD-10-CM | POA: Diagnosis not present

## 2024-03-08 DIAGNOSIS — M48062 Spinal stenosis, lumbar region with neurogenic claudication: Secondary | ICD-10-CM | POA: Diagnosis not present

## 2024-03-08 DIAGNOSIS — Z6826 Body mass index (BMI) 26.0-26.9, adult: Secondary | ICD-10-CM | POA: Diagnosis not present

## 2024-03-13 DIAGNOSIS — E785 Hyperlipidemia, unspecified: Secondary | ICD-10-CM | POA: Diagnosis not present

## 2024-03-13 DIAGNOSIS — Z79899 Other long term (current) drug therapy: Secondary | ICD-10-CM | POA: Diagnosis not present

## 2024-03-14 ENCOUNTER — Ambulatory Visit: Payer: Self-pay | Admitting: Cardiology

## 2024-03-14 LAB — LIPID PANEL
Chol/HDL Ratio: 3.8 ratio (ref 0.0–5.0)
Cholesterol, Total: 138 mg/dL (ref 100–199)
HDL: 36 mg/dL — ABNORMAL LOW (ref >39)
LDL Chol Calc (NIH): 76 mg/dL (ref 0–99)
Triglycerides: 151 mg/dL — ABNORMAL HIGH (ref 0–149)
VLDL Cholesterol Cal: 26 mg/dL (ref 5–40)

## 2024-03-16 DIAGNOSIS — M4857XA Collapsed vertebra, not elsewhere classified, lumbosacral region, initial encounter for fracture: Secondary | ICD-10-CM | POA: Diagnosis not present

## 2024-03-16 DIAGNOSIS — M5416 Radiculopathy, lumbar region: Secondary | ICD-10-CM | POA: Diagnosis not present

## 2024-03-16 DIAGNOSIS — M4186 Other forms of scoliosis, lumbar region: Secondary | ICD-10-CM | POA: Diagnosis not present

## 2024-03-16 DIAGNOSIS — M48061 Spinal stenosis, lumbar region without neurogenic claudication: Secondary | ICD-10-CM | POA: Diagnosis not present

## 2024-03-16 DIAGNOSIS — M5136 Other intervertebral disc degeneration, lumbar region with discogenic back pain only: Secondary | ICD-10-CM | POA: Diagnosis not present

## 2024-03-27 DIAGNOSIS — M9903 Segmental and somatic dysfunction of lumbar region: Secondary | ICD-10-CM | POA: Diagnosis not present

## 2024-03-27 DIAGNOSIS — M5136 Other intervertebral disc degeneration, lumbar region with discogenic back pain only: Secondary | ICD-10-CM | POA: Diagnosis not present

## 2024-03-27 DIAGNOSIS — M9905 Segmental and somatic dysfunction of pelvic region: Secondary | ICD-10-CM | POA: Diagnosis not present

## 2024-03-27 DIAGNOSIS — M9904 Segmental and somatic dysfunction of sacral region: Secondary | ICD-10-CM | POA: Diagnosis not present

## 2024-04-25 DIAGNOSIS — I10 Essential (primary) hypertension: Secondary | ICD-10-CM | POA: Diagnosis not present

## 2024-04-25 DIAGNOSIS — M5416 Radiculopathy, lumbar region: Secondary | ICD-10-CM | POA: Diagnosis not present

## 2024-05-05 DIAGNOSIS — M5416 Radiculopathy, lumbar region: Secondary | ICD-10-CM | POA: Diagnosis not present

## 2024-05-05 DIAGNOSIS — Z6827 Body mass index (BMI) 27.0-27.9, adult: Secondary | ICD-10-CM | POA: Diagnosis not present

## 2024-06-07 ENCOUNTER — Ambulatory Visit: Admitting: Orthopaedic Surgery

## 2024-06-07 VITALS — Ht 68.5 in | Wt 189.0 lb

## 2024-06-07 DIAGNOSIS — M1612 Unilateral primary osteoarthritis, left hip: Secondary | ICD-10-CM | POA: Insufficient documentation

## 2024-06-07 DIAGNOSIS — H40052 Ocular hypertension, left eye: Secondary | ICD-10-CM | POA: Diagnosis not present

## 2024-06-07 NOTE — Progress Notes (Signed)
 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  He actually has x-rays that he brought with him today.  He is a patient of Dr. Suzen Gentry who is his primary care physician but also sees Dr. Victory Gens for his spine and is seeing Dr. Gens for many years now.  He does ambulate with a quad cane in his opposite right hand.  He is not on blood thinning medications and is not a diabetic.  He reports significant left hip and groin pain as well as weakness and popping and catching in the left hip.  I was able to review all of his past medical history and medications within epic.  At this point his left hip pain is daily and it is detrimentally affecting his mobility, his quality of life and his actives daily living.  It is 10 out of 10 pain.  It does radiate down to his left knee as well.  Examination of his left hip shows significant decrease in motion with almost no internal and external rotation secondary to pain and stiffness.  His right hip moves much more smoothly.  X-rays of his pelvis and left hip are reviewed and shows complete loss of joint space the left hip with bone-on-bone wear.  There are sclerotic and cystic changes and osteophytes all around the left hip.  The right hip joint space does have some narrowing and the arthritis is moderate.  We had a long and thorough discussion about hip replacement surgery.  I showed the patient and his wife a hip replacement model and gave them a hand about hip replacement surgery.  I discussed the risk and benefits of the surgery and what to expect from an intraoperative and postoperative standpoint.  All questions and concerns were answered and addressed.  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

## 2024-06-28 ENCOUNTER — Other Ambulatory Visit: Payer: Self-pay | Admitting: Physician Assistant

## 2024-06-28 DIAGNOSIS — Z01818 Encounter for other preprocedural examination: Secondary | ICD-10-CM

## 2024-07-03 NOTE — Pre-Procedure Instructions (Signed)
 Surgical Instructions   Your procedure is scheduled on July 06, 2024. Report to [facility] Main Entrance A at 5:30 A.M., then check in with the Admitting office. Any questions or running late day of surgery: call [phone]  Questions prior to your surgery date: call [phone], Monday-Friday, 8am-4pm. If you experience any cold or flu symptoms such as cough, fever, chills, shortness of breath, etc. between now and your scheduled surgery, please notify us  at the above number.     Remember:  Do not eat after midnight the night before your surgery  You may drink clear liquids until 4:30 AM the morning of your surgery.   Clear liquids allowed are: Water, Non-Citrus Juices (without pulp), Carbonated Beverages, Clear Tea (no milk, honey, etc.), Black Coffee Only (NO MILK, CREAM OR POWDERED CREAMER of any kind), and Gatorade.  Patient Instructions  The night before surgery:  No food after midnight. ONLY clear liquids after midnight  The day of surgery (if you do NOT have diabetes):  Drink ONE (1) Pre-Surgery Clear Ensure by 4:30 AM the morning of surgery. Drink in one sitting. Do not sip.  This drink was given to you during your hospital  pre-op appointment visit.  Nothing else to drink after completing the  Pre-Surgery Clear Ensure.         If you have questions, please contact your surgeon's office.    Take these medicines the morning of surgery with A SIP OF WATER: rosuvastatin  (CRESTOR )  verapamil  (CALAN -SR)    May take these medicines IF NEEDED: esomeprazole (NEXIUM)  famotidine  (PEPCID )  traMADol  (ULTRAM )    Follow your surgeon's instructions on when to stop Aspirin .  If no instructions were given by your surgeon then you will need to call the office to get those instructions.     One week prior to surgery, STOP taking any Aleve, Naproxen, Ibuprofen , Motrin , Advil , Goody's, BC's, all herbal medications, Omega-3 Fatty Acids (OMEGA 3 PO), and non-prescription  vitamins.                     Do NOT Smoke (Tobacco/Vaping) for 24 hours prior to your procedure.  If you use a CPAP at night, you may bring your mask/headgear for your overnight stay.   You will be asked to remove any contacts, glasses, piercing's, hearing aid's, dentures/partials prior to surgery. Please bring cases for these items if needed.    Patients discharged the day of surgery will not be allowed to drive home, and someone needs to stay with them for 24 hours.  SURGICAL WAITING ROOM VISITATION Patients may have no more than 2 support people in the waiting area - these visitors may rotate.   Pre-op nurse will coordinate an appropriate time for 1 ADULT support person, who may not rotate, to accompany patient in pre-op.  Children under the age of 63 must have an adult with them who is not the patient and must remain in the main waiting area with an adult.  If the patient needs to stay at the hospital during part of their recovery, the visitor guidelines for inpatient rooms apply.  Please refer to the [facility] website for the visitor guidelines for any additional information.   If you received a COVID test during your pre-op visit  it is requested that you wear a mask when out in public, stay away from anyone that may not be feeling well and notify your surgeon if you develop symptoms. If you have been in contact  with anyone that has tested positive in the last 10 days please notify you surgeon.      Pre-operative 5 CHG Bathing Instructions   You can play a key role in reducing the risk of infection after surgery. Your skin needs to be as free of germs as possible. You can reduce the number of germs on your skin by washing with CHG (chlorhexidine  gluconate) soap before surgery. CHG is an antiseptic soap that kills germs and continues to kill germs even after washing.   DO NOT use if you have an allergy to chlorhexidine /CHG or antibacterial soaps. If your skin becomes reddened or  irritated, stop using the CHG and notify one of our RNs at [phone].   Please shower with the CHG soap starting 4 days before surgery using the following schedule:     Please keep in mind the following:  DO NOT shave, including legs and underarms, starting the day of your first shower.   You may shave your face at any point before/day of surgery.  Place clean sheets on your bed the day you start using CHG soap. Use a clean washcloth (not used since being washed) for each shower. DO NOT sleep with pets once you start using the CHG.   CHG Shower Instructions:  Wash your face and private area with normal soap. If you choose to wash your hair, wash first with your normal shampoo.  After you use shampoo/soap, rinse your hair and body thoroughly to remove shampoo/soap residue.  Turn the water OFF and apply about 3 tablespoons (45 ml) of CHG soap to a CLEAN washcloth.  Apply CHG soap ONLY FROM YOUR NECK DOWN TO YOUR TOES (washing for 3-5 minutes)  DO NOT use CHG soap on face, private areas, open wounds, or sores.  Pay special attention to the area where your surgery is being performed.  If you are having back surgery, having someone wash your back for you may be helpful. Wait 2 minutes after CHG soap is applied, then you may rinse off the CHG soap.  Pat dry with a clean towel  Put on clean clothes/pajamas   If you choose to wear lotion, please use ONLY the CHG-compatible lotions that are listed below.  Additional instructions for the day of surgery: DO NOT APPLY any lotions, deodorants, cologne, or perfumes.   Do not bring valuables to the hospital. [facility] is not responsible for any belongings/valuables. Do not wear nail polish, gel polish, artificial nails, or any other type of covering on natural nails (fingers and toes) Do not wear jewelry or makeup Put on clean/comfortable clothes.  Please brush your teeth.  Ask your nurse before applying any prescription medications to the  skin.     CHG Compatible Lotions   Aveeno Moisturizing lotion  Cetaphil Moisturizing Cream  Cetaphil Moisturizing Lotion  Clairol Herbal Essence Moisturizing Lotion, Dry Skin  Clairol Herbal Essence Moisturizing Lotion, Extra Dry Skin  Clairol Herbal Essence Moisturizing Lotion, Normal Skin  Curel Age Defying Therapeutic Moisturizing Lotion with Alpha Hydroxy  Curel Extreme Care Body Lotion  Curel Soothing Hands Moisturizing Hand Lotion  Curel Therapeutic Moisturizing Cream, Fragrance-Free  Curel Therapeutic Moisturizing Lotion, Fragrance-Free  Curel Therapeutic Moisturizing Lotion, Original Formula  Eucerin Daily Replenishing Lotion  Eucerin Dry Skin Therapy Plus Alpha Hydroxy Crme  Eucerin Dry Skin Therapy Plus Alpha Hydroxy Lotion  Eucerin Original Crme  Eucerin Original Lotion  Eucerin Plus Crme Eucerin Plus Lotion  Eucerin TriLipid Replenishing Lotion  Keri Anti-Bacterial Hand Lotion  Keri Deep Conditioning Original Lotion Dry Skin Formula Softly Scented  Keri Deep Conditioning Original Lotion, Fragrance Free Sensitive Skin Formula  Keri Lotion Fast Absorbing Fragrance Free Sensitive Skin Formula  Keri Lotion Fast Absorbing Softly Scented Dry Skin Formula  Keri Original Lotion  Keri Skin Renewal Lotion Keri Silky Smooth Lotion  Keri Silky Smooth Sensitive Skin Lotion  Nivea Body Creamy Conditioning Oil  Nivea Body Extra Enriched Lotion  Nivea Body Original Lotion  Nivea Body Sheer Moisturizing Lotion Nivea Crme  Nivea Skin Firming Lotion  NutraDerm 30 Skin Lotion  NutraDerm Skin Lotion  NutraDerm Therapeutic Skin Cream  NutraDerm Therapeutic Skin Lotion  ProShield Protective Hand Cream  Provon moisturizing lotion  Please read over the following fact sheets that you were given.

## 2024-07-04 ENCOUNTER — Encounter (HOSPITAL_COMMUNITY): Payer: Self-pay

## 2024-07-04 ENCOUNTER — Encounter (HOSPITAL_COMMUNITY)
Admission: RE | Admit: 2024-07-04 | Discharge: 2024-07-04 | Disposition: A | Discharge status: Home / Self Care | Source: Ambulatory Visit | Attending: Orthopaedic Surgery | Admitting: Orthopaedic Surgery

## 2024-07-04 ENCOUNTER — Other Ambulatory Visit: Payer: Self-pay

## 2024-07-04 DIAGNOSIS — I10 Essential (primary) hypertension: Secondary | ICD-10-CM | POA: Diagnosis not present

## 2024-07-04 DIAGNOSIS — Q2112 Patent foramen ovale: Secondary | ICD-10-CM | POA: Insufficient documentation

## 2024-07-04 DIAGNOSIS — Z01812 Encounter for preprocedural laboratory examination: Secondary | ICD-10-CM | POA: Diagnosis not present

## 2024-07-04 DIAGNOSIS — M1612 Unilateral primary osteoarthritis, left hip: Secondary | ICD-10-CM | POA: Insufficient documentation

## 2024-07-04 DIAGNOSIS — I4510 Unspecified right bundle-branch block: Secondary | ICD-10-CM | POA: Diagnosis not present

## 2024-07-04 DIAGNOSIS — Z01818 Encounter for other preprocedural examination: Secondary | ICD-10-CM

## 2024-07-04 LAB — CBC
HCT: 43.8 % (ref 39.0–52.0)
Hemoglobin: 14.5 g/dL (ref 13.0–17.0)
MCH: 30.1 pg (ref 26.0–34.0)
MCHC: 33.1 g/dL (ref 30.0–36.0)
MCV: 91.1 fL (ref 80.0–100.0)
Platelets: 159 K/uL (ref 150–400)
RBC: 4.81 MIL/uL (ref 4.22–5.81)
RDW: 12.9 % (ref 11.5–15.5)
WBC: 6.8 K/uL (ref 4.0–10.5)
nRBC: 0 % (ref 0.0–0.2)

## 2024-07-04 LAB — TYPE AND SCREEN
ABO/RH(D): O NEG
Antibody Screen: NEGATIVE

## 2024-07-04 LAB — BASIC METABOLIC PANEL WITH GFR
Anion gap: 10 (ref 5–15)
BUN: 13 mg/dL (ref 8–23)
CO2: 27 mmol/L (ref 22–32)
Calcium: 9.2 mg/dL (ref 8.9–10.3)
Chloride: 101 mmol/L (ref 98–111)
Creatinine, Ser: 1.01 mg/dL (ref 0.61–1.24)
GFR, Estimated: >60 mL/min (ref >60)
Glucose, Bld: 83 mg/dL (ref 70–99)
Potassium: 3.2 mmol/L — ABNORMAL LOW (ref 3.5–5.1)
Sodium: 138 mmol/L (ref 135–145)

## 2024-07-04 LAB — SURGICAL PCR SCREEN
MRSA, PCR: NEGATIVE
Staphylococcus aureus: NEGATIVE

## 2024-07-04 NOTE — Progress Notes (Signed)
 PCP - Dr. Joen Gentry Cardiologist - Dr. Oneil Parchment LOV 11-12-23  PPM/ICD - Denies Device Orders - n/a Rep Notified - n/a  Chest x-ray - N/A EKG - 11-12-23 Stress Test - 09-09-15 ECHO - Denies Cardiac Cath - Denies  Sleep Study - Denies CPAP - n/a  NON-diabetic  Last dose of GLP1 agonist-  Denies GLP1 instructions: n/a  Blood Thinner Instructions: Denies Aspirin  Instructions: Patient stated last dose was on 07-03-24  ERAS Protcol - Clears until 0430 PRE-SURGERY Ensure or G2- Ensure  COVID TEST- n/a   Anesthesia review: Yes, HTN, RBBB  Patient denies shortness of breath, fever, cough and chest pain at PAT appointment. Patient denies any respiratory issues at this time.    All instructions explained to the patient, with a verbal understanding of the material. Patient agrees to go over the instructions while at home for a better understanding. Patient also instructed to self quarantine after being tested for COVID-19. The opportunity to ask questions was provided.

## 2024-07-05 ENCOUNTER — Telehealth: Payer: Self-pay | Admitting: *Deleted

## 2024-07-05 NOTE — Progress Notes (Signed)
 Anesthesia Chart Review:  Case: [id number] Date/Time: 07/06/24 0715   Procedure: ARTHROPLASTY, HIP, TOTAL, ANTERIOR APPROACH (Left: Hip)   Anesthesia type: Spinal   Diagnosis: Primary osteoarthritis of left hip [M16.12]   Pre-op diagnosis: osteoarthritis left hip   [facility] ROOM 08 / [facility]   Surgeons: Vernetta Lonni GRADE, MD       DISCUSSION: Patient is an 83 year old male scheduled for the above procedure.  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 11/25/2023 CCTA showed CAC 216 (35th percentile), 50-69% D1, 25-49% LCX & RCA (no significant stenosis by FFTct), and small PFO.  Cardiology evaluation by Dr. Jeffrie in January 2025 for chest pain. Known RBBB, HTN. CCTA ordered showing moderate non-obstructive CAD, no significant coronary stenosis by FFRct. Continue statin and low dose ASA. Advised exercise and Mediterranean diet.   Anesthesia team to evaluate on the day of surgery.  Last aspirin  reported as 07/03/2024.  VS: BP 129/75   Pulse 62   Temp 36.7 C   Resp 18   Ht 5' 11 (1.803 m)   Wt 85.6 kg   SpO2 96%   BMI 26.32 kg/m   PROVIDERS: Loreli Kins, MD is PCP Jeffrie Anes, MD is cardiologist   LABS: Labs reviewed: Acceptable for surgery. LFTs on 12/30/2023 showed total bilirubin 1.5, ALP 96, AST 28, ALT 20.  (all labs ordered are listed, but only abnormal results are displayed)  Labs Reviewed  BASIC METABOLIC PANEL WITH GFR - Abnormal; Notable for the following components:      Result Value   Potassium 3.2 (*)    All other components within normal limits  SURGICAL PCR SCREEN  CBC  TYPE AND SCREEN    IMAGES: Xray Hips 05/05/2024 ([facility]/PACS): IMPRESSION: 1. Severe advanced left hip degenerative arthropathy as above. 2. No acute finding by plain radiography.  MRI L-spine 03/16/2024 ([facility]opy/PACS): IMPRESSION: - Generalized lumbar spine degeneration with scoliosis.  No progressive finding when compared to last year. - Moderate foraminal stenosis on the right at L3-4 and left at L5-S1.    CT Chest (over read CCTA) 11/25/2023: IMPRESSION: No significant extracardiac findings within the visualized chest.   EKG: 11/12/2023: Normal sinus rhythm Left axis deviation Right bundle branch block When compared with ECG of 07-Sep-2023 18:47, No significant change since last tracing Confirmed by Jeffrie Anes ([id number]) on 11/12/2023 10:03:25 AM   CV: CTA Coronary 11/25/2023: IMPRESSION: 1. Coronary calcium  score of 216. This was 35th percentile for age-, sex, and race-matched controls. 2. Total plaque volume 291 mm3 which is 9th percentile for age- and sex-matched controls (calcified plaque 60 mm3; non-calcified plaque 231 mm3). TPV is severe. 3. Normal coronary origin with right dominance. 4. Moderate stenosis in the first diagonal (50-69%). 5. Mild mixed density plaque (25-49%) in the mid LCX. 6. Mild calcified plaque in the proximal and mid RCA segments (25-49%). 7. Small PFO.  RECOMMENDATIONS: 1. CAD-RADS 3: Moderate stenosis in the first diagonal (50-69%). Consider symptom-guided anti-ischemic pharmacotherapy as well as risk factor modification per guideline directed care. Additional analysis with CT FFR will be submitted.   FFTct 11/25/2023: FINDINGS: 1. Left Main: 0.99; low likelihood of hemodynamic significance. 2. LAD: 0.92; low likelihood of hemodynamic significance. 3. D1: 0.92; low likelihood of hemodynamic significance. 4. LCX: 0.94; low likelihood of hemodynamic significance. 5. RCA: 0.88; low likelihood of hemodynamic significance.   IMPRESSION: 1.  CT FFR analysis didn't show any significant stenosis.   Past Medical History:  Diagnosis Date   Benign essential HTN 09/09/2015   Diverticulitis    Dizziness 09/09/2015   GERD (gastroesophageal reflux disease)    uses Tums occ.   Hypertension    RBBB with left anterior fascicular block      Past Surgical History:  Procedure Laterality Date   COLON RESECTION     diverticulitis   COLONOSCOPY WITH PROPOFOL  N/A 08/21/2014   Procedure: COLONOSCOPY WITH PROPOFOL ;  Surgeon: Gladis MARLA Louder, MD;  Location: [facility]COPY;  Service: Endoscopy;  Laterality: N/A;    MEDICATIONS:  ascorbic acid  (VITAMIN C ) 500 MG tablet   aspirin  81 MG tablet   Cholecalciferol  (VITAMIN D) 50 MCG (2000 UT) CAPS   esomeprazole (NEXIUM) 20 MG capsule   famotidine  (PEPCID ) 20 MG tablet   hydrochlorothiazide  (HYDRODIURIL ) 25 MG tablet   lisinopril  (PRINIVIL ,ZESTRIL ) 40 MG tablet   Magnesium  200 MG TABS   Magnesium  Oxide 400 (240 MG) MG TABS   metoprolol  tartrate (LOPRESSOR ) 50 MG tablet   Multiple Vitamin (MULTIVITAMIN WITH MINERALS) TABS tablet   Omega-3 Fatty Acids (OMEGA 3 PO)   Probiotic Product (PROBIOTIC DAILY PO)   rosuvastatin  (CRESTOR ) 20 MG tablet   tadalafil  (CIALIS ) 10 MG tablet   traMADol  (ULTRAM ) 50 MG tablet   verapamil  (CALAN -SR) 240 MG CR tablet   No current facility-administered medications for this encounter.    Isaiah Ruder, PA-C Surgical Short Stay/Anesthesiology [facility] Phone [phone] [facility] Phone [phone] 07/05/2024 9:41 AM

## 2024-07-05 NOTE — Care Plan (Signed)
 [facility] RNCM call to patient to discuss his upcoming Left total hip arthroplasty with Dr. Vernetta on 07/06/24 at [facility]. He is agreeable to case management. He lives with his spouse, who will be able to assist him at home after d/c home. He has a walker, but it was his mother's and is very old. Would like a newer one if possible before leaving hospital. Anticipate HHPT will be needed after a short hospital stay. Referral made to [facility] after choice provided. Reviewed all post op care instructions. Will continue to follow for needs.

## 2024-07-05 NOTE — Telephone Encounter (Signed)
 [facility] pre-op call completed.

## 2024-07-05 NOTE — H&P (Signed)
 TOTAL HIP ADMISSION H&P  Patient is admitted for left total hip arthroplasty.  Subjective:  Chief Complaint: left hip pain  HPI: [name], 83 y.o. male, has a history of pain and functional disability in the left hip(s) due to arthritis and patient has failed non-surgical conservative treatments for greater than 12 weeks to include use of assistive devices and activity modification.  Onset of symptoms was gradual starting several years ago with gradually worsening course since that time.The patient noted no past surgery on the left hip(s).  Patient currently rates pain in the left hip at 10 out of 10 with activity. Patient has night pain, worsening of pain with activity and weight bearing, trendelenberg gait, pain that interfers with activities of daily living, and pain with passive range of motion. Patient has evidence of subchondral cysts, subchondral sclerosis, periarticular osteophytes, and joint space narrowing by imaging studies. This condition presents safety issues increasing the risk of falls. There is no current active infection.  Patient Active Problem List   Diagnosis Date Noted   Unilateral primary osteoarthritis, left hip 06/07/2024   RBBB with left anterior fascicular block 09/09/2015   Dizziness 09/09/2015   Benign essential HTN 09/09/2015   Chest pain 09/08/2015   Past Medical History:  Diagnosis Date   Benign essential HTN 09/09/2015   Diverticulitis    Dizziness 09/09/2015   GERD (gastroesophageal reflux disease)    uses Tums occ.   Hypertension    RBBB with left anterior fascicular block     Past Surgical History:  Procedure Laterality Date   COLON RESECTION     diverticulitis   COLONOSCOPY WITH PROPOFOL  N/A 08/21/2014   Procedure: COLONOSCOPY WITH PROPOFOL ;  Surgeon: Gladis MARLA Louder, MD;  Location: [facility];  Service: Endoscopy;  Laterality: N/A;    No current facility-administered medications for this encounter.   Current Outpatient Medications   Medication Sig Dispense Refill Last Dose/Taking   ascorbic acid  (VITAMIN C ) 500 MG tablet Take 500 mg by mouth every morning.   Taking   aspirin  81 MG tablet Take 81 mg by mouth daily.   Taking   Cholecalciferol  (VITAMIN D) 50 MCG (2000 UT) CAPS Take 2,000 Units by mouth daily.   Taking   esomeprazole (NEXIUM) 20 MG capsule Take 20 mg by mouth daily as needed (acid reflux).   Taking As Needed   famotidine  (PEPCID ) 20 MG tablet Take 20 mg by mouth at bedtime as needed for heartburn or indigestion.   Taking As Needed   hydrochlorothiazide  (HYDRODIURIL ) 25 MG tablet Take 25 mg by mouth daily.   Taking   lisinopril  (PRINIVIL ,ZESTRIL ) 40 MG tablet Take 40 mg by mouth every morning.   Taking   Magnesium  200 MG TABS Take 200 mg by mouth daily.   Taking   Multiple Vitamin (MULTIVITAMIN WITH MINERALS) TABS tablet Take 1 tablet by mouth every morning.   Taking   Omega-3 Fatty Acids (OMEGA 3 PO) Take 1 capsule by mouth daily.   Taking   Probiotic Product (PROBIOTIC DAILY PO) Take 1 capsule by mouth daily.   Taking   rosuvastatin  (CRESTOR ) 20 MG tablet Take 1 tablet (20 mg total) by mouth daily. Discontinue Atorvastatin 90 tablet 3 Taking   tadalafil  (CIALIS ) 10 MG tablet Take 10 mg by mouth daily as needed for erectile dysfunction.   Taking As Needed   traMADol  (ULTRAM ) 50 MG tablet Take 1 tablet (50 mg total) by mouth every 8 (eight) hours as needed. 30 tablet 0 Taking As  Needed   verapamil  (CALAN -SR) 240 MG CR tablet Take 1 tablet (240 mg total) by mouth daily. 30 tablet 11 Taking   Magnesium  Oxide 400 (240 MG) MG TABS Take 400 mg by mouth 2 (two) times daily. (Patient not taking: Reported on 06/30/2024) 14 tablet 0 Not Taking   metoprolol  tartrate (LOPRESSOR ) 50 MG tablet Take 1 tablet (50 mg total) by mouth once for 1 dose. Take 90-120 minutes prior to scan. Hold for SBP less than 110. (Patient not taking: Reported on 06/30/2024) 1 tablet 0 Not Taking   Allergies  Allergen Reactions   Amlodipine  Swelling     Leg Swelling    Social History   Tobacco Use   Smoking status: Never   Smokeless tobacco: Never  Substance Use Topics   Alcohol use: No    Family History  Problem Relation Age of Onset   Breast cancer Mother    Heart disease Father    Heart attack Father      Review of Systems  Objective:  Physical Exam Vitals reviewed.  Constitutional:      Appearance: Normal appearance. He is normal weight.  HENT:     Head: Normocephalic and atraumatic.  Eyes:     Extraocular Movements: Extraocular movements intact.     Pupils: Pupils are equal, round, and reactive to light.  Cardiovascular:     Rate and Rhythm: Normal rate.  Pulmonary:     Effort: Pulmonary effort is normal.  Abdominal:     Palpations: Abdomen is soft.  Musculoskeletal:     Cervical back: Normal range of motion and neck supple.     Left hip: Tenderness and bony tenderness present. Decreased range of motion. Decreased strength.  Neurological:     Mental Status: He is alert and oriented to person, place, and time.  Psychiatric:        Behavior: Behavior normal.     Vital signs in last 24 hours:    Labs:   Estimated body mass index is 26.32 kg/m as calculated from the following:   Height as of 07/04/24: 5' 11 (1.803 m).   Weight as of 07/04/24: 85.6 kg.   Imaging Review Plain radiographs demonstrate severe degenerative joint disease of the left hip(s). The bone quality appears to be good for age and reported activity level.      Assessment/Plan:  End stage arthritis, left hip(s)  The patient history, physical examination, clinical judgement of the provider and imaging studies are consistent with end stage degenerative joint disease of the left hip(s) and total hip arthroplasty is deemed medically necessary. The treatment options including medical management, injection therapy, arthroscopy and arthroplasty were discussed at length. The risks and benefits of total hip arthroplasty were presented and  reviewed. The risks due to aseptic loosening, infection, stiffness, dislocation/subluxation,  thromboembolic complications and other imponderables were discussed.  The patient acknowledged the explanation, agreed to proceed with the plan and consent was signed. Patient is being admitted for inpatient treatment for surgery, pain control, PT, OT, prophylactic antibiotics, VTE prophylaxis, progressive ambulation and ADL's and discharge planning.The patient is planning to be discharged home with home health services

## 2024-07-05 NOTE — Anesthesia Preprocedure Evaluation (Addendum)
 Anesthesia Evaluation  Patient identified by MRN, date of birth, ID band Patient awake    Reviewed: Allergy & Precautions, NPO status , Patient's Chart, lab work & pertinent test results  Airway Mallampati: II  TM Distance: >3 FB Neck ROM: Full    Dental  (+) Poor Dentition, Teeth Intact   Pulmonary neg pulmonary ROS   Pulmonary exam normal breath sounds clear to auscultation       Cardiovascular hypertension, Pt. on medications Normal cardiovascular exam Rhythm:Regular Rate:Normal  11/25/2023 CCTA showed CAC 216 (35th percentile), 50-69% D1, 25-49% LCX & RCA (no significant stenosis by FFTct), and small PFO.  EKG RBBB    Neuro/Psych negative neurological ROS  negative psych ROS   GI/Hepatic Neg liver ROS,GERD  ,,  Endo/Other  negative endocrine ROS    Renal/GU negative Renal ROS  negative genitourinary   Musculoskeletal  (+) Arthritis ,    Abdominal   Peds  Hematology negative hematology ROS (+)   Anesthesia Other Findings   Reproductive/Obstetrics                              Anesthesia Physical Anesthesia Plan  ASA: 2  Anesthesia Plan: Spinal   Post-op Pain Management: Tylenol  PO (pre-op)*   Induction:   PONV Risk Score and Plan: 1 and Treatment may vary due to age or medical condition, Propofol  infusion, Ondansetron  and Dexamethasone   Airway Management Planned: Natural Airway  Additional Equipment:   Intra-op Plan:   Post-operative Plan:   Informed Consent: I have reviewed the patients History and Physical, chart, labs and discussed the procedure including the risks, benefits and alternatives for the proposed anesthesia with the patient or authorized representative who has indicated his/her understanding and acceptance.     Dental advisory given  Plan Discussed with: CRNA  Anesthesia Plan Comments: (PAT note written 07/05/2024 by Allison Zelenak, PA-C.  )          Anesthesia Quick Evaluation

## 2024-07-06 ENCOUNTER — Other Ambulatory Visit: Payer: Self-pay

## 2024-07-06 ENCOUNTER — Ambulatory Visit (HOSPITAL_COMMUNITY): Payer: Self-pay | Admitting: Vascular Surgery

## 2024-07-06 ENCOUNTER — Ambulatory Visit (HOSPITAL_COMMUNITY)

## 2024-07-06 ENCOUNTER — Observation Stay (HOSPITAL_COMMUNITY)

## 2024-07-06 ENCOUNTER — Encounter (HOSPITAL_COMMUNITY)
Admission: RE | Disposition: A | Discharge status: Home / Self Care | Payer: Self-pay | Source: Home / Self Care | Attending: Orthopaedic Surgery

## 2024-07-06 ENCOUNTER — Encounter (HOSPITAL_COMMUNITY): Payer: Self-pay | Admitting: Orthopaedic Surgery

## 2024-07-06 ENCOUNTER — Observation Stay (HOSPITAL_COMMUNITY)
Admission: RE | Admit: 2024-07-06 | Discharge: 2024-07-07 | Disposition: A | Discharge status: Home / Self Care | Attending: Orthopaedic Surgery | Admitting: Orthopaedic Surgery

## 2024-07-06 ENCOUNTER — Ambulatory Visit (HOSPITAL_BASED_OUTPATIENT_CLINIC_OR_DEPARTMENT_OTHER): Payer: Self-pay | Admitting: Anesthesiology

## 2024-07-06 DIAGNOSIS — Z7982 Long term (current) use of aspirin: Secondary | ICD-10-CM | POA: Diagnosis not present

## 2024-07-06 DIAGNOSIS — Z471 Aftercare following joint replacement surgery: Secondary | ICD-10-CM | POA: Diagnosis not present

## 2024-07-06 DIAGNOSIS — Z79899 Other long term (current) drug therapy: Secondary | ICD-10-CM | POA: Diagnosis not present

## 2024-07-06 DIAGNOSIS — M1612 Unilateral primary osteoarthritis, left hip: Principal | ICD-10-CM | POA: Diagnosis present

## 2024-07-06 DIAGNOSIS — I10 Essential (primary) hypertension: Secondary | ICD-10-CM | POA: Insufficient documentation

## 2024-07-06 DIAGNOSIS — Z96642 Presence of left artificial hip joint: Secondary | ICD-10-CM | POA: Insufficient documentation

## 2024-07-06 HISTORY — PX: TOTAL HIP ARTHROPLASTY: SHX124

## 2024-07-06 LAB — ABO/RH: ABO/RH(D): O NEG

## 2024-07-06 SURGERY — ARTHROPLASTY, HIP, TOTAL, ANTERIOR APPROACH
Anesthesia: Spinal | Site: Hip | Laterality: Left

## 2024-07-06 MED ORDER — MAGNESIUM 200 MG PO TABS: 200.0000 mg | ORAL_TABLET | Freq: Every day | ORAL | Status: DC

## 2024-07-06 MED ORDER — ALBUMIN HUMAN 5 % IV SOLN
INTRAVENOUS | Status: AC
  Filled 2024-07-06: qty 250

## 2024-07-06 MED ORDER — FENTANYL CITRATE (PF) 100 MCG/2ML IJ SOLN: 25.0000 ug | INTRAMUSCULAR | Status: DC | PRN

## 2024-07-06 MED ORDER — ALBUMIN HUMAN 5 % IV SOLN
12.5000 g | Freq: Once | INTRAVENOUS | Status: AC
  Administered 2024-07-06: 12.5 g via INTRAVENOUS

## 2024-07-06 MED ORDER — PROPOFOL 10 MG/ML IV BOLUS
INTRAVENOUS | Status: DC | PRN
  Administered 2024-07-06: 30 ug/kg/min via INTRAVENOUS
  Administered 2024-07-06: 40 mg via INTRAVENOUS

## 2024-07-06 MED ORDER — LIDOCAINE 2% (20 MG/ML) 5 ML SYRINGE
INTRAMUSCULAR | Status: AC
  Filled 2024-07-06: qty 5

## 2024-07-06 MED ORDER — VERAPAMIL HCL ER 240 MG PO TBCR
240.0000 mg | EXTENDED_RELEASE_TABLET | Freq: Every day | ORAL | Status: DC
Start: 2024-07-07 — End: 2024-07-07
  Filled 2024-07-06: qty 1

## 2024-07-06 MED ORDER — 0.9 % SODIUM CHLORIDE (POUR BTL) OPTIME
TOPICAL | Status: DC | PRN
  Administered 2024-07-06: 1000 mL

## 2024-07-06 MED ORDER — AMISULPRIDE (ANTIEMETIC) 5 MG/2ML IV SOLN: 10.0000 mg | Freq: Once | INTRAVENOUS | Status: DC | PRN

## 2024-07-06 MED ORDER — ASPIRIN 81 MG PO CHEW
81.0000 mg | CHEWABLE_TABLET | Freq: Two times a day (BID) | ORAL | Status: DC
  Administered 2024-07-06 – 2024-07-07 (×2): 81 mg via ORAL
  Filled 2024-07-06 (×2): qty 1

## 2024-07-06 MED ORDER — LIDOCAINE 2% (20 MG/ML) 5 ML SYRINGE
INTRAMUSCULAR | Status: DC | PRN
  Administered 2024-07-06: 40 mg via INTRAVENOUS

## 2024-07-06 MED ORDER — EPHEDRINE SULFATE-NACL 50-0.9 MG/10ML-% IV SOSY
PREFILLED_SYRINGE | INTRAVENOUS | Status: DC | PRN
  Administered 2024-07-06 (×2): 5 mg via INTRAVENOUS

## 2024-07-06 MED ORDER — VITAMIN D 25 MCG (1000 UNIT) PO TABS
2000.0000 [IU] | ORAL_TABLET | Freq: Every day | ORAL | Status: DC
  Administered 2024-07-06 – 2024-07-07 (×2): 2000 [IU] via ORAL
  Filled 2024-07-06 (×4): qty 2

## 2024-07-06 MED ORDER — TRANEXAMIC ACID-NACL 1000-0.7 MG/100ML-% IV SOLN
1000.0000 mg | INTRAVENOUS | Status: AC
Start: 2024-07-06 — End: 2024-07-06
  Administered 2024-07-06: 1000 mg via INTRAVENOUS
  Filled 2024-07-06: qty 100

## 2024-07-06 MED ORDER — MENTHOL 3 MG MT LOZG: 1.0000 | LOZENGE | OROMUCOSAL | Status: DC | PRN

## 2024-07-06 MED ORDER — PANTOPRAZOLE SODIUM 40 MG PO TBEC
40.0000 mg | DELAYED_RELEASE_TABLET | Freq: Every day | ORAL | Status: DC
  Administered 2024-07-06 – 2024-07-07 (×2): 40 mg via ORAL
  Filled 2024-07-06 (×2): qty 1

## 2024-07-06 MED ORDER — OXYCODONE HCL 5 MG PO TABS: 10.0000 mg | ORAL_TABLET | ORAL | Status: DC | PRN

## 2024-07-06 MED ORDER — POVIDONE-IODINE 10 % EX SWAB: 2.0000 | Freq: Once | CUTANEOUS | Status: DC

## 2024-07-06 MED ORDER — OXYCODONE HCL 5 MG/5ML PO SOLN: 5.0000 mg | Freq: Once | ORAL | Status: DC | PRN

## 2024-07-06 MED ORDER — METHOCARBAMOL 1000 MG/10ML IJ SOLN: 500.0000 mg | Freq: Four times a day (QID) | INTRAMUSCULAR | Status: DC | PRN

## 2024-07-06 MED ORDER — PHENOL 1.4 % MT LIQD: 1.0000 | OROMUCOSAL | Status: DC | PRN

## 2024-07-06 MED ORDER — ONDANSETRON HCL 4 MG PO TABS: 4.0000 mg | ORAL_TABLET | Freq: Four times a day (QID) | ORAL | Status: DC | PRN

## 2024-07-06 MED ORDER — SODIUM CHLORIDE 0.9 % IR SOLN
Status: DC | PRN
  Administered 2024-07-06: 3000 mL

## 2024-07-06 MED ORDER — DOCUSATE SODIUM 100 MG PO CAPS
100.0000 mg | ORAL_CAPSULE | Freq: Two times a day (BID) | ORAL | Status: DC
  Administered 2024-07-06 – 2024-07-07 (×3): 100 mg via ORAL
  Filled 2024-07-06 (×3): qty 1

## 2024-07-06 MED ORDER — CHLORHEXIDINE GLUCONATE 0.12 % MT SOLN
OROMUCOSAL | Status: AC
  Administered 2024-07-06: 15 mL
  Filled 2024-07-06: qty 15

## 2024-07-06 MED ORDER — LISINOPRIL 20 MG PO TABS
40.0000 mg | ORAL_TABLET | ORAL | Status: DC
  Filled 2024-07-06: qty 2

## 2024-07-06 MED ORDER — HYDROCHLOROTHIAZIDE 25 MG PO TABS
25.0000 mg | ORAL_TABLET | Freq: Every day | ORAL | Status: DC
  Administered 2024-07-06 – 2024-07-07 (×2): 25 mg via ORAL
  Filled 2024-07-06 (×2): qty 1

## 2024-07-06 MED ORDER — DEXAMETHASONE SODIUM PHOSPHATE 10 MG/ML IJ SOLN
INTRAMUSCULAR | Status: AC
Start: 2024-07-06 — End: 2024-07-06
  Filled 2024-07-06: qty 1

## 2024-07-06 MED ORDER — HYDROMORPHONE HCL 1 MG/ML IJ SOLN: 0.5000 mg | INTRAMUSCULAR | Status: DC | PRN

## 2024-07-06 MED ORDER — ONDANSETRON HCL 4 MG/2ML IJ SOLN: 4.0000 mg | Freq: Four times a day (QID) | INTRAMUSCULAR | Status: DC | PRN

## 2024-07-06 MED ORDER — DEXAMETHASONE SODIUM PHOSPHATE 10 MG/ML IJ SOLN
INTRAMUSCULAR | Status: DC | PRN
Start: 2024-07-06 — End: 2024-07-06
  Administered 2024-07-06: 10 mg via INTRAVENOUS

## 2024-07-06 MED ORDER — METOCLOPRAMIDE HCL 5 MG/ML IJ SOLN: 5.0000 mg | Freq: Three times a day (TID) | INTRAMUSCULAR | Status: DC | PRN

## 2024-07-06 MED ORDER — ACETAMINOPHEN 500 MG PO TABS
1000.0000 mg | ORAL_TABLET | Freq: Once | ORAL | Status: AC
  Administered 2024-07-06: 1000 mg via ORAL
  Filled 2024-07-06: qty 2

## 2024-07-06 MED ORDER — METOCLOPRAMIDE HCL 5 MG PO TABS: 5.0000 mg | ORAL_TABLET | Freq: Three times a day (TID) | ORAL | Status: DC | PRN

## 2024-07-06 MED ORDER — ROSUVASTATIN CALCIUM 20 MG PO TABS
20.0000 mg | ORAL_TABLET | Freq: Every day | ORAL | Status: DC
  Administered 2024-07-06: 20 mg via ORAL
  Filled 2024-07-06 (×2): qty 1

## 2024-07-06 MED ORDER — CEFAZOLIN SODIUM-DEXTROSE 2-4 GM/100ML-% IV SOLN
2.0000 g | INTRAVENOUS | Status: AC
  Administered 2024-07-06: 2 g via INTRAVENOUS
  Filled 2024-07-06: qty 100

## 2024-07-06 MED ORDER — OXYCODONE HCL 5 MG PO TABS
5.0000 mg | ORAL_TABLET | ORAL | Status: DC | PRN
  Administered 2024-07-06 (×3): 5 mg via ORAL
  Filled 2024-07-06: qty 1
  Filled 2024-07-06: qty 2

## 2024-07-06 MED ORDER — CEFAZOLIN SODIUM-DEXTROSE 2-4 GM/100ML-% IV SOLN
2.0000 g | Freq: Four times a day (QID) | INTRAVENOUS | Status: AC
  Administered 2024-07-06 (×2): 2 g via INTRAVENOUS
  Filled 2024-07-06 (×2): qty 100

## 2024-07-06 MED ORDER — LACTATED RINGERS IV SOLN: INTRAVENOUS | Status: DC | PRN

## 2024-07-06 MED ORDER — MAGNESIUM OXIDE -MG SUPPLEMENT 400 (240 MG) MG PO TABS
200.0000 mg | ORAL_TABLET | Freq: Every day | ORAL | Status: DC
  Administered 2024-07-06 – 2024-07-07 (×2): 200 mg via ORAL
  Filled 2024-07-06 (×2): qty 1

## 2024-07-06 MED ORDER — PHENYLEPHRINE 80 MCG/ML (10ML) SYRINGE FOR IV PUSH (FOR BLOOD PRESSURE SUPPORT)
PREFILLED_SYRINGE | INTRAVENOUS | Status: DC | PRN
  Administered 2024-07-06 (×2): 80 ug via INTRAVENOUS

## 2024-07-06 MED ORDER — FAMOTIDINE 20 MG PO TABS: 20.0000 mg | ORAL_TABLET | Freq: Every evening | ORAL | Status: DC | PRN

## 2024-07-06 MED ORDER — ONDANSETRON HCL 4 MG/2ML IJ SOLN
INTRAMUSCULAR | Status: AC
  Filled 2024-07-06: qty 2

## 2024-07-06 MED ORDER — SODIUM CHLORIDE 0.9 % IV SOLN
INTRAVENOUS | Status: DC
Start: 2024-07-06 — End: 2024-07-07

## 2024-07-06 MED ORDER — FENTANYL CITRATE (PF) 250 MCG/5ML IJ SOLN
INTRAMUSCULAR | Status: AC
  Filled 2024-07-06: qty 5

## 2024-07-06 MED ORDER — ONDANSETRON HCL 4 MG/2ML IJ SOLN
INTRAMUSCULAR | Status: DC | PRN
  Administered 2024-07-06: 4 mg via INTRAVENOUS

## 2024-07-06 MED ORDER — DIPHENHYDRAMINE HCL 12.5 MG/5ML PO ELIX: 12.5000 mg | ORAL_SOLUTION | ORAL | Status: DC | PRN

## 2024-07-06 MED ORDER — FENTANYL CITRATE (PF) 250 MCG/5ML IJ SOLN
INTRAMUSCULAR | Status: DC | PRN
  Administered 2024-07-06: 50 ug via INTRAVENOUS

## 2024-07-06 MED ORDER — VITAMIN C 500 MG PO TABS
500.0000 mg | ORAL_TABLET | ORAL | Status: DC
  Administered 2024-07-07: 500 mg via ORAL
  Filled 2024-07-06: qty 1

## 2024-07-06 MED ORDER — ACETAMINOPHEN 325 MG PO TABS
325.0000 mg | ORAL_TABLET | Freq: Four times a day (QID) | ORAL | Status: DC | PRN
  Administered 2024-07-06 – 2024-07-07 (×2): 650 mg via ORAL
  Filled 2024-07-06 (×2): qty 2

## 2024-07-06 MED ORDER — OXYCODONE HCL 5 MG PO TABS
ORAL_TABLET | ORAL | Status: AC
  Filled 2024-07-06: qty 1

## 2024-07-06 MED ORDER — METHOCARBAMOL 500 MG PO TABS
500.0000 mg | ORAL_TABLET | Freq: Four times a day (QID) | ORAL | Status: DC | PRN
  Administered 2024-07-06 – 2024-07-07 (×3): 500 mg via ORAL
  Filled 2024-07-06 (×3): qty 1

## 2024-07-06 MED ORDER — OXYCODONE HCL 5 MG PO TABS: 5.0000 mg | ORAL_TABLET | Freq: Once | ORAL | Status: DC | PRN

## 2024-07-06 MED ORDER — PHENYLEPHRINE HCL-NACL 20-0.9 MG/250ML-% IV SOLN
INTRAVENOUS | Status: DC | PRN
  Administered 2024-07-06: 20 ug/min via INTRAVENOUS

## 2024-07-06 SURGICAL SUPPLY — 39 items
BENZOIN TINCTURE PRP APPL 2/3 (GAUZE/BANDAGES/DRESSINGS) ×2 IMPLANT
BLADE CLIPPER SURG (BLADE) IMPLANT
BLADE SAW SGTL 18X1.27X75 (BLADE) ×2 IMPLANT
COVER SURGICAL LIGHT HANDLE (MISCELLANEOUS) ×2 IMPLANT
CUP SECTOR GRIPTON 58MM (Orthopedic Implant) IMPLANT
DRAPE C-ARM 42X72 X-RAY (DRAPES) ×2 IMPLANT
DRAPE STERI IOBAN 125X83 (DRAPES) ×2 IMPLANT
DRAPE U-SHAPE 47X51 STRL (DRAPES) ×6 IMPLANT
DRSG AQUACEL AG ADV 3.5X10 (GAUZE/BANDAGES/DRESSINGS) ×2 IMPLANT
DURAPREP 26ML APPLICATOR (WOUND CARE) ×2 IMPLANT
ELECT BLADE 6.5 EXT (BLADE) IMPLANT
ELECTRODE BLDE 4.0 EZ CLN MEGD (MISCELLANEOUS) ×2 IMPLANT
ELECTRODE REM PT RTRN 9FT ADLT (ELECTROSURGICAL) ×2 IMPLANT
FACESHIELD WRAPAROUND OR TEAM (MASK) ×4 IMPLANT
GLOVE BIOGEL PI IND STRL 8 (GLOVE) ×4 IMPLANT
GOWN STRL REUS W/ TWL LRG LVL3 (GOWN DISPOSABLE) ×4 IMPLANT
GOWN STRL REUS W/ TWL XL LVL3 (GOWN DISPOSABLE) ×4 IMPLANT
HEAD ARTICULEZE (Hips) IMPLANT
KIT BASIN OR (CUSTOM PROCEDURE TRAY) ×2 IMPLANT
KIT TURNOVER KIT B (KITS) ×2 IMPLANT
LINER NEUTRAL 36X58 PLUS4 IMPLANT
MANIFOLD NEPTUNE II (INSTRUMENTS) ×2 IMPLANT
NS IRRIG 1000ML POUR BTL (IV SOLUTION) ×2 IMPLANT
PACK TOTAL JOINT (CUSTOM PROCEDURE TRAY) ×2 IMPLANT
PAD ARMBOARD POSITIONER FOAM (MISCELLANEOUS) ×2 IMPLANT
SET HNDPC FAN SPRY TIP SCT (DISPOSABLE) ×2 IMPLANT
STAPLER SKIN PROX 35W (STAPLE) IMPLANT
STAPLER SKIN PROX WIDE 3.9 (STAPLE) IMPLANT
STEM FEMORAL SZ6 HIGH ACTIS (Stem) IMPLANT
STRIP CLOSURE SKIN 1/2X4 (GAUZE/BANDAGES/DRESSINGS) ×4 IMPLANT
SUT ETHIBOND NAB CT1 #1 30IN (SUTURE) ×2 IMPLANT
SUT MNCRL AB 4-0 PS2 18 (SUTURE) IMPLANT
SUT VIC AB 0 CT1 27XBRD ANBCTR (SUTURE) ×2 IMPLANT
SUT VIC AB 1 CT1 27XBRD ANBCTR (SUTURE) ×2 IMPLANT
SUT VIC AB 2-0 CT1 TAPERPNT 27 (SUTURE) ×2 IMPLANT
TOWEL GREEN STERILE (TOWEL DISPOSABLE) ×2 IMPLANT
TOWEL GREEN STERILE FF (TOWEL DISPOSABLE) ×2 IMPLANT
TRAY FOL W/BAG SLVR 16FR STRL (SET/KITS/TRAYS/PACK) IMPLANT
WATER STERILE IRR 1000ML POUR (IV SOLUTION) ×4 IMPLANT

## 2024-07-06 NOTE — Op Note (Signed)
 Operative Note  Date of operation: 07/06/2024 Preoperative diagnosis: Left hip primary osteoarthritis Postoperative diagnosis: Same  Procedure: Left direct anterior total hip arthroplasty  Implants: Implant Name Type Inv. Item Serial No. Manufacturer Lot No. LRB No. Used Action  CUP SECTOR GRIPTON - [id number] Orthopedic Implant CUP SECTOR GRIPTON  DEPUY ORTHOPAED[employer] [id number] Left 1 Implanted  LINER NEUTRAL 36X58 PLUS4 - [id number]  LINER NEUTRAL 36X58 PLUS4  DEPUY ORTHOPAEDICS [id number] Left 1 Implanted  HEAD ARTICULEZE - [id number] Hips HEAD ARTICULEZE  DEPUY ORTHOPAEDICS [id number] Left 1 Implanted  STEM FEMORAL SZ6 HIGH ACTIS - [id number] Stem STEM FEMORAL SZ6 HIGH ACTIS  DEPUY ORTHOPAEDICS [id number] Left 1 Implanted   Surgeon: Lonni GRADE. Vernetta, MD Assistant: Tory Gaskins, PA-C  Anesthesia: Spinal Antibiotics: IV Ancef  EBL: 50 cc Complications: None  Indications: The patient is an active 83 year old gentleman with well-documented end-stage arthritis of his left hip.  His x-rays show complete loss of joint space with bone-on-bone wear.  He does walk with a limp and there is a leg length difference as well with time given his arthritis of the left hip making his left side shorter.  This is because back pain as well.  At this point his pain is a daily basis with his left hip and it is detrimentally putting his mobility, his quality of life and his activities daily living to the point he does wish proceed with a hip replacement and we agree with this as well.  We did discuss in length in detail the risks of acute blood loss anemia, nerve and vessel injury, fracture, infection, DVT, dislocation, implant failure, leg length differences and wound healing issues.  He understands that our goals are hopefully decreased pain, improved mobility and improve quality of life.  Procedure description: After informed consent was obtained and the appropriate left hip was marked, the patient was  brought to the operating room and set up on the stretcher where spinal anesthesia was obtained.  He was then laid in supine position on stretcher and a Foley catheter was placed.  Traction boots were placed on both his feet and next he was placed supine on the Hana fracture table with a perineal post and placed in both legs in inline skeletal traction devices but no traction applied.  His left operative hip and pelvis were assessed radiographically.  The left hip was prepped and draped with DuraPrep and sterile drapes.  A timeout was called and he was identified as the correct patient to correct the left hip.  An incision was then made just inferior and posterior to the ASIS and carried slightly obliquely down the leg.  Dissection was carried down to the tensor fascia lata muscle and the tensor fascia was then divided longitudinally to proceed with a direct interposed the hip.  Circumflex vessels were identified and cauterized.  The hip capsule was identified and opened up in L-type format.  There was significant cartilage wear throughout the left hip.  Cobra retractors were placed around the medial and lateral femoral neck and a femoral neck cut was made with an oscillating saw just proximal to the lesser trochanter and this cut was completed with an osteotome.  A corkscrew guide was placed in the femoral head and the femoral head was removed in its entirety and there was a wide area devoid of cartilage.  A bent Hohmann was then placed over the medial acetabular rim and remnants of the acetabular labrum and other debris removed.  Reaming was initiated from a  size 43 reamer and stepwise increments going up to a size 57 reamer with all reamers placed under direct visualization and the last reamer placed under direct fluoroscopy in order to obtain the depth of reaming, the inclination and the anteversion.  The real DePuy Sectra GRIPTION acetabular component size 58 was then placed without difficulty followed by a 36+4  polyethylene liner.  Attention was then turned to the femur.  With the left leg externally rotated to 120 degrees, extended and adducted, a Mueller retractor was placed medially and a Hohmann tractor behind the greater trochanter.  The lateral joint capsule was released and a box cutting osteotome was used to enter the femoral canal.  Broaching was then initiated using the Actis broaching system from a size 0 going up to a size 6.  With a size 6 in place we trialed a high offset femoral neck and a 36+1.5 trial head ball.  The left leg was brought over and up and with traction and internal rotation reduced in the pelvis.  We assessed it clinically and radiographically and felt like we just needed a little more leg length.  We dislocated the hip and removed the trial components.  We then placed the real Actis femoral component with high offset size 6 and the real 36+5 metal head ball.  Again this was reduced in the pelvis we are pleased with leg length, offset, range of motion and stability assessed both radiographically and clinically.  The soft tissue was then irrigated with normal saline solution.  Remnants of the joint capsule were closed with interrupted #1 Ethibond suture.  A #1 Vicryl was used to close the tensor fascia followed by 0 Vicryl because the deep tissue and 2-0 Vicryl because of subcutaneous tissue.  The skin was closed with staples.  An Aquacel dressing was applied.  The patient was taken off the Hana table and taken the recovery room.  Tory Gaskins, PA-C did assist during the entire case and beginning in and his assistance was crucial and medically necessary for soft tissue management and retraction, helping guide implant placement and a layered closure of the wound.

## 2024-07-06 NOTE — Interval H&P Note (Signed)
 History and Physical Interval Note: The patient understands that he is here for a left total hip replacement to treat his significant left hip pain and arthritis.  There has been no recent interval change in his medical status.  The risks and benefits of surgery have been discussed in detail and informed consent has been obtained.  The left operative hip has been marked.  07/06/2024 7:10 AM  [name]  has presented today for surgery, with the diagnosis of osteoarthritis left hip.  The various methods of treatment have been discussed with the patient and family. After consideration of risks, benefits and other options for treatment, the patient has consented to  Procedure(s): ARTHROPLASTY, HIP, TOTAL, ANTERIOR APPROACH (Left) as a surgical intervention.  The patient's history has been reviewed, patient examined, no change in status, stable for surgery.  I have reviewed the patient's chart and labs.  Questions were answered to the patient's satisfaction.     [name]

## 2024-07-06 NOTE — Transfer of Care (Signed)
 Immediate Anesthesia Transfer of Care Note  Patient: [name]  Procedure(s) Performed: ARTHROPLASTY, HIP, TOTAL, ANTERIOR APPROACH (Left: Hip)  Patient Location: PACU  Anesthesia Type:MAC and Spinal  Level of Consciousness: awake, alert , and oriented  Airway & Oxygen Therapy: Patient Spontanous Breathing  Post-op Assessment: Report given to RN and Post -op Vital signs reviewed and unstable, Anesthesiologist notified  Post vital signs: Reviewed and stable  Last Vitals:  Vitals Value Taken Time  BP 83/45 07/06/24 09:10  Temp    Pulse 45 07/06/24 09:12  Resp 16 07/06/24 09:12  SpO2 99 % 07/06/24 09:12  Vitals shown include unfiled device data.  Last Pain:  Vitals:   07/06/24 0633  PainSc: 2       Patients Stated Pain Goal: 3 (07/06/24 9366)  Complications: No notable events documented.

## 2024-07-06 NOTE — Evaluation (Signed)
 Physical Therapy Evaluation Patient Details Name: [name] MRN: [id number] DOB: 05/19/1941 Today's Date: 07/06/2024  History of Present Illness  Pt is an 83 y.o. male who presented 07/06/24 for elective L direct anterior THA. PMH: HTN, diverticulitis, GERD, HTN, RBBB with L anterior fascicular block  Clinical Impression  Pt presents with condition above and deficits mentioned below, see PT Problem List. PTA, he was independent without DME until his L hip pain increased these past few months, in which pt began to use a QC. He lives with his wife in a 2-level house with 3 STE. He does not have to go upstairs. Currently, pt is displaying limitations in L hip ROM and strength expected after THA. He is also currently relying on a RW for standing mobility due to L hip pain impacting his balance and activity tolerance. He will likely progress well as his pain improves though. He is currently only requiring CGA-supervision for safety with all functional mobility. Follow physician's recommendations for discharge plan and follow up therapies. Will continue to follow acutely.        If plan is discharge home, recommend the following: A little help with walking and/or transfers;A little help with bathing/dressing/bathroom;Assistance with cooking/housework;Assist for transportation;Help with stairs or ramp for entrance   Can travel by private vehicle        Equipment Recommendations Rolling walker (2 wheels);BSC/3in1  Recommendations for Other Services       Functional Status Assessment Patient has had a recent decline in their functional status and demonstrates the ability to make significant improvements in function in a reasonable and predictable amount of time.     Precautions / Restrictions Precautions Precautions: Fall Precaution/Restrictions Comments: provided pt with THA HEP handout Restrictions Weight Bearing Restrictions Per Provider Order: Yes LLE Weight Bearing Per Provider  Order: Weight bearing as tolerated      Mobility  Bed Mobility Overal bed mobility: Needs Assistance Bed Mobility: Supine to Sit     Supine to sit: Supervision, HOB elevated, Used rails     General bed mobility comments: Pt used L bed rail to pull and pivot his body to sit up L EOB, HOB elevated, supervision for safety    Transfers Overall transfer level: Needs assistance Equipment used: Rolling walker (2 wheels) Transfers: Sit to/from Stand Sit to Stand: Contact guard assist           General transfer comment: Cues needed for hand placement, pt able to stand from EOB with CGA for safety and no LOB    Ambulation/Gait Ambulation/Gait assistance: Contact guard assist Gait Distance (Feet): 120 Feet Assistive device: Rolling walker (2 wheels) Gait Pattern/deviations: Step-through pattern, Decreased stance time - left, Decreased stride length, Decreased weight shift to left, Antalgic Gait velocity: reduced Gait velocity interpretation: <1.8 ft/sec, indicate of risk for recurrent falls   General Gait Details: Pt ambulates fairly steadily with step-through gait pattern. He does have a mild antalgic gait pattern with reduced L weight shift and stance time due to pain though. No LOB, CGA for safety  Stairs            Wheelchair Mobility     Tilt Bed    Modified Rankin (Stroke Patients Only)       Balance Overall balance assessment: Needs assistance Sitting-balance support: No upper extremity supported, Feet supported Sitting balance-Leahy Scale: Good     Standing balance support: Bilateral upper extremity supported, During functional activity, Reliant on assistive device for balance Standing balance-Leahy Scale: Poor Standing  balance comment: reliant on RW to ambulate                             Pertinent Vitals/Pain Pain Assessment Pain Assessment: 0-10 Pain Score: 4  Pain Location: L hip Pain Descriptors / Indicators: Discomfort, Grimacing,  Guarding, Operative site guarding Pain Intervention(s): Limited activity within patient's tolerance, Monitored during session, Premedicated before session, Repositioned    Home Living Family/patient expects to be discharged to:: Private residence Living Arrangements: Spouse/significant other Available Help at Discharge: Family;Available 24 hours/day Type of Home: House Home Access: Stairs to enter Entrance Stairs-Rails: Left Entrance Stairs-Number of Steps: 3   Home Layout: Two level;Able to live on main level with bedroom/bathroom Home Equipment: Cane - quad;Grab bars - toilet;Wheelchair - manual      Prior Function Prior Level of Function : Independent/Modified Independent;Driving             Mobility Comments: In past few months has been using QC due to L hip pain, prior to that was independent without AD       Extremity/Trunk Assessment   Upper Extremity Assessment Upper Extremity Assessment: Overall WFL for tasks assessed    Lower Extremity Assessment Lower Extremity Assessment: LLE deficits/detail (tightness in bil hamstrings noted) LLE Deficits / Details: able to flex hip against gravity <50% thus with MMT score of 2+/5, AROM limited by pain s/p THA    Cervical / Trunk Assessment Cervical / Trunk Assessment: Normal  Communication   Communication Communication: No apparent difficulties    Cognition Arousal: Alert Behavior During Therapy: WFL for tasks assessed/performed   PT - Cognitive impairments: No apparent impairments                         Following commands: Intact       Cueing Cueing Techniques: Verbal cues     General Comments General comments (skin integrity, edema, etc.): Provided pt with THA HEP handout, encouraged beginning exercises to tolerance and elevating leg and applying ice to manage edema    Exercises     Assessment/Plan    PT Assessment Patient needs continued PT services  PT Problem List Decreased  strength;Decreased activity tolerance;Decreased balance;Decreased mobility;Decreased range of motion;Pain       PT Treatment Interventions DME instruction;Gait training;Stair training;Functional mobility training;Therapeutic activities;Therapeutic exercise;Balance training;Neuromuscular re-education;Patient/family education    PT Goals (Current goals can be found in the Care Plan section)  Acute Rehab PT Goals Patient Stated Goal: to improve and go home tomorrow PT Goal Formulation: With patient/family Time For Goal Achievement: 07/13/24 Potential to Achieve Goals: Good    Frequency 7X/week     Co-evaluation               AM-PAC PT 6 Clicks Mobility  Outcome Measure Help needed turning from your back to your side while in a flat bed without using bedrails?: A Little Help needed moving from lying on your back to sitting on the side of a flat bed without using bedrails?: A Little Help needed moving to and from a bed to a chair (including a wheelchair)?: A Little Help needed standing up from a chair using your arms (e.g., wheelchair or bedside chair)?: A Little Help needed to walk in hospital room?: A Little Help needed climbing 3-5 steps with a railing? : A Little 6 Click Score: 18    End of Session Equipment Utilized During Treatment: Gait belt Activity  Tolerance: Patient tolerated treatment well Patient left: in chair;with call bell/phone within reach;with chair alarm set;with family/visitor present Nurse Communication: Mobility status PT Visit Diagnosis: Unsteadiness on feet (R26.81);Other abnormalities of gait and mobility (R26.89);Muscle weakness (generalized) (M62.81);Difficulty in walking, not elsewhere classified (R26.2);Pain Pain - Right/Left: Left Pain - part of body: Hip    Time: 8470-8445 PT Time Calculation (min) (ACUTE ONLY): 25 min   Charges:   PT Evaluation $PT Eval Low Complexity: 1 Low PT Treatments $Therapeutic Activity: 8-22 mins PT General  Charges $$ ACUTE PT VISIT: 1 Visit         Theo Ferretti, PT, DPT Acute Rehabilitation Services  Office: [phone]   Theo CHRISTELLA Ferretti 07/06/2024, 5:00 PM

## 2024-07-07 ENCOUNTER — Encounter (HOSPITAL_COMMUNITY): Payer: Self-pay | Admitting: Orthopaedic Surgery

## 2024-07-07 DIAGNOSIS — M1612 Unilateral primary osteoarthritis, left hip: Secondary | ICD-10-CM | POA: Diagnosis not present

## 2024-07-07 LAB — CBC
HCT: 34.4 % — ABNORMAL LOW (ref 39.0–52.0)
Hemoglobin: 11.9 g/dL — ABNORMAL LOW (ref 13.0–17.0)
MCH: 30.8 pg (ref 26.0–34.0)
MCHC: 34.6 g/dL (ref 30.0–36.0)
MCV: 89.1 fL (ref 80.0–100.0)
Platelets: 124 K/uL — ABNORMAL LOW (ref 150–400)
RBC: 3.86 MIL/uL — ABNORMAL LOW (ref 4.22–5.81)
RDW: 12.9 % (ref 11.5–15.5)
WBC: 11.9 K/uL — ABNORMAL HIGH (ref 4.0–10.5)
nRBC: 0 % (ref 0.0–0.2)

## 2024-07-07 LAB — BASIC METABOLIC PANEL WITH GFR
Anion gap: 9 (ref 5–15)
BUN: 17 mg/dL (ref 8–23)
CO2: 24 mmol/L (ref 22–32)
Calcium: 8.2 mg/dL — ABNORMAL LOW (ref 8.9–10.3)
Chloride: 107 mmol/L (ref 98–111)
Creatinine, Ser: 1.26 mg/dL — ABNORMAL HIGH (ref 0.61–1.24)
GFR, Estimated: 57 mL/min — ABNORMAL LOW (ref >60)
Glucose, Bld: 162 mg/dL — ABNORMAL HIGH (ref 70–99)
Potassium: 3.6 mmol/L (ref 3.5–5.1)
Sodium: 140 mmol/L (ref 135–145)

## 2024-07-07 MED ORDER — METHOCARBAMOL 500 MG PO TABS: 500.0000 mg | ORAL_TABLET | Freq: Four times a day (QID) | ORAL | 0 refills | Status: AC | PRN

## 2024-07-07 MED ORDER — OXYCODONE HCL 5 MG PO TABS: 5.0000 mg | ORAL_TABLET | Freq: Four times a day (QID) | ORAL | 0 refills | Status: AC | PRN

## 2024-07-07 MED ORDER — BUPIVACAINE IN DEXTROSE 0.75-8.25 % IT SOLN
INTRATHECAL | Status: DC | PRN
  Administered 2024-07-06: 1.6 mL via INTRATHECAL

## 2024-07-07 MED ORDER — ASPIRIN 81 MG PO CHEW: 81.0000 mg | CHEWABLE_TABLET | Freq: Two times a day (BID) | ORAL | 0 refills | Status: AC

## 2024-07-07 NOTE — Progress Notes (Signed)
 Subjective: 1 Day Post-Op Procedure(s) (LRB): ARTHROPLASTY, HIP, TOTAL, ANTERIOR APPROACH (Left) Patient reports pain as mild.    Objective: Vital signs in last 24 hours: Temp:  [97.6 F (36.4 C)-97.9 F (36.6 C)] 97.9 F (36.6 C) (09/12 0515) Pulse Rate:  [47-77] 77 (09/12 0515) Resp:  [11-21] 18 (09/12 0515) BP: (79-124)/(44-74) 124/60 (09/12 0515) SpO2:  [94 %-100 %] 97 % (09/12 0515)  Intake/Output from previous day: 09/11 0701 - 09/12 0700 In: 949.2 [I.V.:749.2; IV Piggyback:200] Out: 2250 [Urine:2150; Blood:100] Intake/Output this shift: Total I/O In: -  Out: 650 [Urine:650]  Recent Labs    07/04/24 1400 07/07/24 0443  HGB 14.5 11.9*   Recent Labs    07/04/24 1400 07/07/24 0443  WBC 6.8 11.9*  RBC 4.81 3.86*  HCT 43.8 34.4*  PLT 159 124*   Recent Labs    07/04/24 1400 07/07/24 0443  NA 138 140  K 3.2* 3.6  CL 101 107  CO2 27 24  BUN 13 17  CREATININE 1.01 1.26*  GLUCOSE 83 162*  CALCIUM  9.2 8.2*   No results for input(s): LABPT, INR in the last 72 hours.  Sensation intact distally Intact pulses distally Dorsiflexion/Plantar flexion intact Incision: dressing C/D/I   Assessment/Plan: 1 Day Post-Op Procedure(s) (LRB): ARTHROPLASTY, HIP, TOTAL, ANTERIOR APPROACH (Left) Up with therapy Discharge home with home health this afternoon.      [name] 07/07/2024, 6:43 AM

## 2024-07-07 NOTE — Anesthesia Procedure Notes (Signed)
 Spinal  Patient location during procedure: OR Start time: 07/06/2024 7:32 AM End time: 07/06/2024 7:34 AM Reason for block: surgical anesthesia Staffing Performed: anesthesiologist  Anesthesiologist: Niels Marien CROME, MD Performed by: Niels Marien CROME, MD Authorized by: Niels Marien CROME, MD   Preanesthetic Checklist Completed: patient identified, IV checked, risks and benefits discussed, surgical consent, monitors and equipment checked, pre-op evaluation and timeout performed Spinal Block Patient position: sitting Prep: DuraPrep and site prepped and draped Patient monitoring: cardiac monitor, continuous pulse ox and blood pressure Approach: midline Location: L3-4 Injection technique: single-shot Needle Needle type: Pencan  Needle gauge: 24 G Needle length: 9 cm Assessment Sensory level: T6 Events: CSF return Additional Notes Functioning IV was confirmed and monitors were applied. Sterile prep and drape, including hand hygiene and sterile gloves were used. The patient was positioned and the spine was prepped. The skin was anesthetized with lidocaine .  Free flow of clear CSF was obtained prior to injecting local anesthetic into the CSF.  The spinal needle aspirated freely following injection.  The needle was carefully withdrawn.  The patient tolerated the procedure well.

## 2024-07-07 NOTE — Progress Notes (Signed)
 Reviewed AVS, patient expressed understanding of medications, MD follow up reviewed.   Removed IV, Site clean, dry and intact.  See LDA for information on wounds at discharge. Patient states all belongings brought to the hospital at time of admission are accounted for and packed to take home.  Patient informed and expressed understanding where to pick up discharge medications.  [facility] contacted to transport patient to Discharge lounge to wait for DME-rolling walker. Wife will be waiting with him and will transport home after receiving roller walker.

## 2024-07-07 NOTE — Discharge Instructions (Signed)

## 2024-07-07 NOTE — Plan of Care (Signed)
   Problem: Safety: Goal: Ability to remain free from injury will improve Outcome: Progressing   Problem: Pain Management: Goal: Pain level will decrease with appropriate interventions Outcome: Progressing   Problem: Skin Integrity: Goal: Will show signs of wound healing Outcome: Progressing

## 2024-07-07 NOTE — TOC Transition Note (Signed)
 Transition of Care [facility]) - Discharge Note   Patient Details  Name: [name] MRN: [id number] Date of Birth: 1941/06/03  [facility] CM/SW Contact:  Rosalva Jon Bloch, RN Phone Number: 07/07/2024, 10:50 AM   Clinical Narrative:    Patient will DC to: home Anticipated DC date: 07/07/2024 Family notified: yes Transport by: car     S/p Left  total hip arthroplasty 9/11 Per MD patient ready for DC today . RN, patient, patient's family, and [facility] notified of DC. Wife to assist with care once home. Referral made for DME: RW with [employer]/ [employer]([phone]). Equipment will be delivered to d/c lounge. Pt without RX med concerns or transportation issues. Post hospital f/u noted on AVS.  RNCM will sign off for now as intervention is no longer needed. Please consult us  again if new needs arise.    Final next level of care: Home w Home Health Services Barriers to Discharge: No Barriers Identified   Patient Goals and CMS Choice            Discharge Placement                       Discharge Plan and Services Additional resources added to the After Visit Summary for                  DME Arranged: Walker rolling DME Agency: [employer] Date DME Agency Contacted: 07/07/24 Time DME Agency Contacted: 0930 Representative spoke with at DME Agency: [place] HH Arranged: PT HH Agency: [facility] Date [facility] Agency Contacted: 07/07/24 Time HH Agency Contacted: 1049 Representative spoke with at [facility] Agency: [place]  Social Drivers of Health (SDOH) Interventions SDOH Screenings   Food Insecurity: No Food Insecurity (07/06/2024)  Housing: Low Risk  (07/06/2024)  Transportation Needs: No Transportation Needs (07/06/2024)  Utilities: Not At Risk (07/06/2024)  Social Connections: Unknown (07/06/2024)  Tobacco Use: Low Risk  (07/06/2024)     Readmission Risk Interventions     No data to display

## 2024-07-07 NOTE — Progress Notes (Signed)
 Physical Therapy Treatment Patient Details Name: [name] MRN: [id number] DOB: 08-03-1941 Today's Date: 07/07/2024   History of Present Illness Pt is an 83 y.o. male who presented 07/06/24 for elective L direct anterior THA. PMH: HTN, diverticulitis, GERD, HTN, RBBB with L anterior fascicular block    PT Comments  Pt resting in bed on arrival, pleasant and eager for mobility with pt demonstrating good progress towards acute goals. Pt able to progress stair training this session with grossly CGA for safety with cues for LE sequencing with pt ascend/descending x4 steps to simulate home entrance and x2 steps to simulate entry to den with varying rail configurations. Pt continues to demonstrate bed mobility, transfers and gait at grossly CGA to supervision level with RW for support. Pt was educated on continued walker use to maximize functional independence, safety, and decrease risk for falls as well as appropriate activity progression, ice, safe car entry/exit, HEP and compliance and importance of continued mobility with pt verbalizing understanding. Anticipate safe discharge, with assist level outlined below, once medically cleared, will continue to follow acutely.     If plan is discharge home, recommend the following: A little help with walking and/or transfers;A little help with bathing/dressing/bathroom;Assistance with cooking/housework;Assist for transportation;Help with stairs or ramp for entrance   Can travel by private vehicle        Equipment Recommendations  Rolling walker (2 wheels);BSC/3in1    Recommendations for Other Services       Precautions / Restrictions Precautions Precautions: Fall Restrictions Weight Bearing Restrictions Per Provider Order: Yes LLE Weight Bearing Per Provider Order: Weight bearing as tolerated     Mobility  Bed Mobility Overal bed mobility: Needs Assistance Bed Mobility: Supine to Sit     Supine to sit: Supervision, HOB elevated, Used  rails     General bed mobility comments: Pt used L bed rail to pull and pivot his body to sit up L EOB, HOB elevated, supervision for safety    Transfers Overall transfer level: Needs assistance Equipment used: Rolling walker (2 wheels) Transfers: Sit to/from Stand Sit to Stand: Contact guard assist           General transfer comment: Cues needed for hand placement, pt able to stand from EOB na standard height char without armrests with CGA for safety and no LOB    Ambulation/Gait Ambulation/Gait assistance: Supervision, Contact guard assist Gait Distance (Feet): 90 Feet (+ 300') Assistive device: Rolling walker (2 wheels) Gait Pattern/deviations: Step-through pattern, Decreased stance time - left, Decreased stride length, Decreased weight shift to left, Antalgic Gait velocity: reduced     General Gait Details: Pt ambulates fairly steadily with step-through gait pattern. He does have a mild antalgic gait pattern with reduced L weight shift and stance time due to pain though. No LOB, CGA fading to supervsion for safety   Stairs Stairs: Yes Stairs assistance: Contact guard assist Stair Management: One rail Left, One rail Right, Step to pattern, Forwards, Sideways Number of Stairs: 4 (+ 2) General stair comments: pt able to ascend/descend 4 steps with sideways technique utilizing BUE support on L rail to simulate home entrance with cues for LE sequencing and CGA for safety. pt then able to ascend/descend x2 steps with unilateral UE support on R rail to simulate steps to/from [employer]    Modified Rankin (Stroke Patients Only)       Balance Overall balance assessment: Needs assistance Sitting-balance support: No upper extremity  supported, Feet supported Sitting balance-Leahy Scale: Good     Standing balance support: Bilateral upper extremity supported, During functional activity, Reliant on assistive device for balance Standing  balance-Leahy Scale: Poor Standing balance comment: reliant on RW to ambulate                            Communication Communication Communication: No apparent difficulties  Cognition Arousal: Alert Behavior During Therapy: WFL for tasks assessed/performed   PT - Cognitive impairments: No apparent impairments                         Following commands: Intact      Cueing Cueing Techniques: Verbal cues  Exercises      General Comments        Pertinent Vitals/Pain Pain Assessment Pain Assessment: Faces Faces Pain Scale: Hurts a little bit Pain Location: L hip Pain Descriptors / Indicators: Discomfort, Grimacing, Guarding, Operative site guarding Pain Intervention(s): Monitored during session, Limited activity within patient's tolerance    Home Living Family/patient expects to be discharged to:: Private residence Living Arrangements: Spouse/significant other                      Prior Function            PT Goals (current goals can now be found in the care plan section) Acute Rehab PT Goals Patient Stated Goal: to go home PT Goal Formulation: With patient/family Time For Goal Achievement: 07/13/24 Progress towards PT goals: Progressing toward goals    Frequency    7X/week      PT Plan      Co-evaluation              AM-PAC PT 6 Clicks Mobility   Outcome Measure  Help needed turning from your back to your side while in a flat bed without using bedrails?: A Little Help needed moving from lying on your back to sitting on the side of a flat bed without using bedrails?: A Little Help needed moving to and from a bed to a chair (including a wheelchair)?: A Little Help needed standing up from a chair using your arms (e.g., wheelchair or bedside chair)?: A Little Help needed to walk in hospital room?: A Little Help needed climbing 3-5 steps with a railing? : A Little 6 Click Score: 18    End of Session Equipment Utilized  During Treatment: Gait belt Activity Tolerance: Patient tolerated treatment well Patient left: in chair;with call bell/phone within reach;with family/visitor present Nurse Communication: Mobility status;Other (comment) (pt without need for PM session) PT Visit Diagnosis: Unsteadiness on feet (R26.81);Other abnormalities of gait and mobility (R26.89);Muscle weakness (generalized) (M62.81);Difficulty in walking, not elsewhere classified (R26.2);Pain Pain - Right/Left: Left Pain - part of body: Hip     Time: 0842-0906 PT Time Calculation (min) (ACUTE ONLY): 24 min  Charges:    $Gait Training: 23-37 mins PT General Charges $$ ACUTE PT VISIT: 1 Visit                     Shantel Wesely R. PTA Acute Rehabilitation Services Office: [phone]   Therisa CHRISTELLA Boor 07/07/2024, 9:16 AM

## 2024-07-07 NOTE — Discharge Summary (Signed)
 Patient ID: DEMARIUS ARCHILA MRN: [id number] DOB/AGE: 82-Oct-1942 81 y.o.  Admit date: 07/06/2024 Discharge date: 07/07/2024  Admission Diagnoses:  Principal Problem:   Unilateral primary osteoarthritis, left hip Active Problems:   Status post total replacement of left hip   Discharge Diagnoses:  Same  Past Medical History:  Diagnosis Date   Benign essential HTN 09/09/2015   Diverticulitis    Dizziness 09/09/2015   GERD (gastroesophageal reflux disease)    uses Tums occ.   Hypertension    RBBB with left anterior fascicular block     Surgeries: Procedure(s): ARTHROPLASTY, HIP, TOTAL, ANTERIOR APPROACH on 07/06/2024   Consultants:   Discharged Condition: Improved  Hospital Course: CATHERINE OAK is an 83 y.o. male who was admitted 07/06/2024 for operative treatment ofUnilateral primary osteoarthritis, left hip. Patient has severe unremitting pain that affects sleep, daily activities, and work/hobbies. After pre-op clearance the patient was taken to the operating room on 07/06/2024 and underwent  Procedure(s): ARTHROPLASTY, HIP, TOTAL, ANTERIOR APPROACH.    Patient was given perioperative antibiotics:  Anti-infectives (From admission, onward)    Start     Dose/Rate Route Frequency Ordered Stop   07/06/24 1530  ceFAZolin  (ANCEF ) IVPB 2g/100 mL premix        2 g 200 mL/hr over 30 Minutes Intravenous Every 6 hours 07/06/24 1348 07/07/24 0806   07/06/24 0630  ceFAZolin  (ANCEF ) IVPB 2g/100 mL premix        2 g 200 mL/hr over 30 Minutes Intravenous On call to O.R. 07/06/24 9380 07/06/24 0741        Patient was given sequential compression devices, early ambulation, and chemoprophylaxis to prevent DVT.  Inpatient Morphine Milligram Equivalents Per Day 9/11 - 9/12   Values displayed are in units of MME/Day    Order Start / End Date Yesterday Today    oxyCODONE  (Oxy IR/ROXICODONE ) immediate release tablet 5 mg 9/11 - 9/11 0 of Unknown --    oxyCODONE  (ROXICODONE ) 5 MG/5ML  solution 5 mg 9/11 - 9/11 0 of Unknown --      Group total: 0 of Unknown     fentaNYL  (SUBLIMAZE ) injection 25-50 mcg 9/11 - 9/11 0 of 45-90 --    fentaNYL  citrate (PF) (SUBLIMAZE ) injection 9/11 - 9/11 *15 of 15 --    Daily Totals  * 15 of Unknown (at least 60-105) --  *One-Step medication  Calculation Errors     Order Type Date Details   oxyCODONE  (Oxy IR/ROXICODONE ) immediate release tablet 5 mg Ordered Dose -- Insufficient frequency information   oxyCODONE  (ROXICODONE ) 5 MG/5ML solution 5 mg Ordered Dose -- Insufficient frequency information            Patient benefited maximally from hospital stay and there were no complications.    Recent vital signs: Patient Vitals for the past 24 hrs:  BP Temp Temp src Pulse Resp SpO2  07/07/24 0817 139/69 97.6 F (36.4 C) Oral 79 19 95 %  07/07/24 0515 124/60 97.9 F (36.6 C) Oral 77 18 97 %  07/06/24 2123 121/68 97.9 F (36.6 C) Oral 71 16 95 %  07/06/24 1349 109/62 97.7 F (36.5 C) Oral 62 16 94 %  07/06/24 1315 95/71 -- -- (!) 58 14 95 %  07/06/24 1300 108/64 -- -- (!) 59 12 95 %  07/06/24 1245 114/64 -- -- (!) 59 18 96 %  07/06/24 1230 111/74 -- -- (!) 58 14 97 %  07/06/24 1215 112/60 -- -- (!) 58 14 96 %  07/06/24 1200 114/68 -- -- (!) 58 14 98 %  07/06/24 1145 (!) 102/57 -- -- (!) 55 12 96 %  07/06/24 1130 (!) 102/54 -- -- (!) 55 14 95 %  07/06/24 1115 (!) 104/59 -- -- (!) 55 12 96 %  07/06/24 1100 (!) 96/57 -- -- (!) 53 13 95 %     Recent laboratory studies:  Recent Labs    07/04/24 1400 07/07/24 0443  WBC 6.8 11.9*  HGB 14.5 11.9*  HCT 43.8 34.4*  PLT 159 124*  NA 138 140  K 3.2* 3.6  CL 101 107  CO2 27 24  BUN 13 17  CREATININE 1.01 1.26*  GLUCOSE 83 162*  CALCIUM  9.2 8.2*     Discharge Medications:   Allergies as of 07/07/2024       Reactions   Amlodipine  Swelling   Leg Swelling        Medication List     STOP taking these medications    aspirin  81 MG tablet Replaced by: aspirin  81 MG  chewable tablet       TAKE these medications    ascorbic acid  500 MG tablet Commonly known as: VITAMIN C  Take 500 mg by mouth every morning.   aspirin  81 MG chewable tablet Chew 1 tablet (81 mg total) by mouth 2 (two) times daily. Replaces: aspirin  81 MG tablet   esomeprazole 20 MG capsule Commonly known as: NEXIUM Take 20 mg by mouth daily as needed (acid reflux).   famotidine  20 MG tablet Commonly known as: PEPCID  Take 20 mg by mouth at bedtime as needed for heartburn or indigestion.   hydrochlorothiazide  25 MG tablet Commonly known as: HYDRODIURIL  Take 25 mg by mouth daily.   lisinopril  40 MG tablet Commonly known as: ZESTRIL  Take 40 mg by mouth every morning.   Magnesium  200 MG Tabs Take 200 mg by mouth daily.   methocarbamol  500 MG tablet Commonly known as: ROBAXIN  Take 1 tablet (500 mg total) by mouth every 6 (six) hours as needed for muscle spasms.   metoprolol  tartrate 50 MG tablet Commonly known as: LOPRESSOR  Take 1 tablet (50 mg total) by mouth once for 1 dose. Take 90-120 minutes prior to scan. Hold for SBP less than 110.   multivitamin with minerals Tabs tablet Take 1 tablet by mouth every morning.   OMEGA 3 PO Take 1 capsule by mouth daily.   oxyCODONE  5 MG immediate release tablet Commonly known as: Oxy IR/ROXICODONE  Take 1-2 tablets (5-10 mg total) by mouth every 6 (six) hours as needed for moderate pain (pain score 4-6) (pain score 4-6).   PROBIOTIC DAILY PO Take 1 capsule by mouth daily.   rosuvastatin  20 MG tablet Commonly known as: CRESTOR  Take 1 tablet (20 mg total) by mouth daily. Discontinue Atorvastatin   tadalafil  10 MG tablet Commonly known as: CIALIS  Take 10 mg by mouth daily as needed for erectile dysfunction.   traMADol  50 MG tablet Commonly known as: ULTRAM  Take 1 tablet (50 mg total) by mouth every 8 (eight) hours as needed.   verapamil  240 MG CR tablet Commonly known as: CALAN -SR Take 1 tablet (240 mg total) by mouth  daily.   Vitamin D 50 MCG (2000 UT) Caps Take 2,000 Units by mouth daily.               Durable Medical Equipment  (From admission, onward)           Start     Ordered   07/06/24 1349  DME 3 n 1  Once        07/06/24 1348   07/06/24 1349  DME Walker rolling  Once       Question Answer Comment  Walker: With 5 Inch Wheels   Patient needs a walker to treat with the following condition Status post total replacement of left hip      07/06/24 1348            Diagnostic Studies: DG Pelvis Portable Result Date: 07/06/2024 CLINICAL DATA:  Left hip replacement. EXAM: PORTABLE PELVIS 1-2 VIEWS COMPARISON:  None Available. FINDINGS: Left femoral and acetabular components are well situated. Expected postoperative changes seen in the surrounding soft tissues. IMPRESSION: Status post left total hip arthroplasty. Electronically Signed   By: Lynwood Landy Raddle M.D.   On: 07/06/2024 13:51   DG HIP UNILAT WITH PELVIS 1V LEFT Result Date: 07/06/2024 CLINICAL DATA:  Elective surgery. EXAM: DG HIP (WITH OR WITHOUT PELVIS) 1V*L* COMPARISON:  05/05/2024. FINDINGS: Multiple intraoperative fluoroscopic spot images are provided. Interval left total hip arthroplasty. Normal alignment. Total fluoroscopy time: 21 seconds Total dose: Radiation Exposure Index (as provided by the fluoroscopic device): 2.43 mGy air Kerma Please see intraoperative findings for further detail. IMPRESSION: Intraoperative fluoroscopy of interval left total hip arthroplasty. Electronically Signed   By: Harrietta Sherry M.D.   On: 07/06/2024 10:46   DG C-Arm 1-60 Min-No Report Result Date: 07/06/2024 Fluoroscopy was utilized by the requesting physician.  No radiographic interpretation.    Disposition: Discharge disposition: 01-Home or Self Care          Follow-up Information     Vernetta Lonni GRADE, MD Follow up in 2 week(s).   Specialty: Orthopedic Surgery Contact information: [address] [place]  [address] [phone]         Loreli Kins, MD Follow up.   Specialty: Family Medicine Contact information: [address] [place] [address] [phone]         [facility] Follow up.   Specialty: Home Health Services Why: home health services will be provided by [facility] Contact information: [address] [place] [address] [phone]                  Signed: Lonni GRADE Vernetta 07/07/2024, 10:55 AM

## 2024-07-07 NOTE — Anesthesia Postprocedure Evaluation (Signed)
 Anesthesia Post Note  Patient: Clem DELENA Drought  Procedure(s) Performed: ARTHROPLASTY, HIP, TOTAL, ANTERIOR APPROACH (Left: Hip)     Patient location during evaluation: PACU Anesthesia Type: Spinal Level of consciousness: oriented and awake and alert Pain management: pain level controlled Vital Signs Assessment: post-procedure vital signs reviewed and stable Respiratory status: spontaneous breathing, respiratory function stable and patient connected to nasal cannula oxygen Cardiovascular status: blood pressure returned to baseline and stable Postop Assessment: no headache, no backache and no apparent nausea or vomiting Anesthetic complications: no   No notable events documented.  Last Vitals:  Vitals:   07/07/24 0515 07/07/24 0817  BP: 124/60 139/69  Pulse: 77 79  Resp: 18 19  Temp: 36.6 C 36.4 C  SpO2: 97% 95%    Last Pain:  Vitals:   07/07/24 0817  TempSrc: Oral  PainSc:                  Janissa Bertram L Glenette Bookwalter

## 2024-07-08 DIAGNOSIS — Z471 Aftercare following joint replacement surgery: Secondary | ICD-10-CM | POA: Diagnosis not present

## 2024-07-08 DIAGNOSIS — M1611 Unilateral primary osteoarthritis, right hip: Secondary | ICD-10-CM | POA: Diagnosis not present

## 2024-07-08 DIAGNOSIS — Z96642 Presence of left artificial hip joint: Secondary | ICD-10-CM | POA: Diagnosis not present

## 2024-07-08 DIAGNOSIS — I444 Left anterior fascicular block: Secondary | ICD-10-CM | POA: Diagnosis not present

## 2024-07-08 DIAGNOSIS — I10 Essential (primary) hypertension: Secondary | ICD-10-CM | POA: Diagnosis not present

## 2024-07-08 DIAGNOSIS — Z7982 Long term (current) use of aspirin: Secondary | ICD-10-CM | POA: Diagnosis not present

## 2024-07-08 DIAGNOSIS — Z556 Problems related to health literacy: Secondary | ICD-10-CM | POA: Diagnosis not present

## 2024-07-08 DIAGNOSIS — K219 Gastro-esophageal reflux disease without esophagitis: Secondary | ICD-10-CM | POA: Diagnosis not present

## 2024-07-08 DIAGNOSIS — I4510 Unspecified right bundle-branch block: Secondary | ICD-10-CM | POA: Diagnosis not present

## 2024-07-08 DIAGNOSIS — K5790 Diverticulosis of intestine, part unspecified, without perforation or abscess without bleeding: Secondary | ICD-10-CM | POA: Diagnosis not present

## 2024-07-20 ENCOUNTER — Ambulatory Visit (INDEPENDENT_AMBULATORY_CARE_PROVIDER_SITE_OTHER): Admitting: Orthopaedic Surgery

## 2024-07-20 DIAGNOSIS — Z96642 Presence of left artificial hip joint: Secondary | ICD-10-CM

## 2024-07-20 NOTE — Progress Notes (Signed)
 The patient is a 83 year old gentleman who comes in for his first postoperative visit status post a left total hip replacement to treat significant left hip pain and arthritis.  He reports good range of motion and strength and is doing well.  He is walking without any assistive device and is not taking pain medication either.  He has been compliant with a baby aspirin  twice a day which he was on once a day prior to surgery.  His incision looks great.  Staples are removed and Steri-Strips applied.  He does have a moderate seroma and I did aspirate 60 cc of fluid from the seroma.  His calf is soft.  He can go back to just once a day baby aspirin .  Will see him back in a month to see how he is doing overall.  If he does have a reaccumulation of the seroma and is bothersome he can see us  before then.  At his next visit no x-rays are needed.

## 2024-07-21 ENCOUNTER — Telehealth: Payer: Self-pay | Admitting: *Deleted

## 2024-07-21 NOTE — Telephone Encounter (Signed)
 Patient called and states that where the seroma was drained yesterday has hardened overnight and is sore. I had him remove band aid and tell me if it had reddened or bruised and he says no. I explained it was probably just a re-accumulation of fluid or even some blood and to watch it and apply heat to see if it absorbs. Explained a little pain may be normal after the needle was there. Any other recommendations?

## 2024-08-21 ENCOUNTER — Encounter: Payer: Self-pay | Admitting: Orthopaedic Surgery

## 2024-08-21 ENCOUNTER — Ambulatory Visit (INDEPENDENT_AMBULATORY_CARE_PROVIDER_SITE_OTHER): Admitting: Orthopaedic Surgery

## 2024-08-21 DIAGNOSIS — Z96642 Presence of left artificial hip joint: Secondary | ICD-10-CM

## 2024-08-21 NOTE — Progress Notes (Signed)
 The patient is an active 83 year old gentleman who is now over 6 weeks status post a left hip replacement to treat severe left hip pain and arthritis.  He says he is doing great overall.  He feels like a new man.  He is walking without assistive device and says his ADLs are easier and his mobility is better.  On exam his gait is improved.  His left hip moves smoothly and fluidly.  There is no limp.  He is walking without any assistive device.  He will continue to increase his activities as comfort allows with no restrictions.  Will see him back in 6 months with a standing AP pelvis and lateral of his left operative hip.  If there are issues before then he knows to reach out.

## 2024-08-23 DIAGNOSIS — L821 Other seborrheic keratosis: Secondary | ICD-10-CM | POA: Diagnosis not present

## 2024-08-23 DIAGNOSIS — L570 Actinic keratosis: Secondary | ICD-10-CM | POA: Diagnosis not present

## 2024-08-23 DIAGNOSIS — L820 Inflamed seborrheic keratosis: Secondary | ICD-10-CM | POA: Diagnosis not present

## 2024-08-23 DIAGNOSIS — D1801 Hemangioma of skin and subcutaneous tissue: Secondary | ICD-10-CM | POA: Diagnosis not present

## 2024-08-23 DIAGNOSIS — L720 Epidermal cyst: Secondary | ICD-10-CM | POA: Diagnosis not present

## 2024-08-23 DIAGNOSIS — Z85828 Personal history of other malignant neoplasm of skin: Secondary | ICD-10-CM | POA: Diagnosis not present

## 2024-08-23 DIAGNOSIS — D2271 Melanocytic nevi of right lower limb, including hip: Secondary | ICD-10-CM | POA: Diagnosis not present

## 2024-08-28 ENCOUNTER — Encounter: Payer: Self-pay | Admitting: Radiology

## 2024-10-11 ENCOUNTER — Telehealth: Payer: Self-pay | Admitting: *Deleted

## 2024-10-11 NOTE — Telephone Encounter (Signed)
 90 day post op call completed related to hip replacement by Dr. Vernetta.

## 2025-01-29 ENCOUNTER — Ambulatory Visit: Admitting: Orthopaedic Surgery
